# Patient Record
Sex: Male | Born: 1990 | Race: White | Hispanic: No | Marital: Single | Smoking: Former smoker
Health system: Southern US, Community
[De-identification: ages and names within clinical notes are randomized; demographics above are authoritative.]

## PROBLEM LIST (undated history)

## (undated) DIAGNOSIS — Z9889 Other specified postprocedural states: Secondary | ICD-10-CM

## (undated) DIAGNOSIS — F32A Depression, unspecified: Secondary | ICD-10-CM

## (undated) DIAGNOSIS — R112 Nausea with vomiting, unspecified: Secondary | ICD-10-CM

## (undated) DIAGNOSIS — F419 Anxiety disorder, unspecified: Secondary | ICD-10-CM

## (undated) DIAGNOSIS — J45909 Unspecified asthma, uncomplicated: Secondary | ICD-10-CM

## (undated) HISTORY — DX: Depression, unspecified: F32.A

## (undated) HISTORY — PX: KNEE SURGERY: SHX244

---
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
## 2011-10-16 ENCOUNTER — Emergency Department (HOSPITAL_COMMUNITY)
Admission: EM | Admit: 2011-10-16 | Discharge: 2011-10-16 | Disposition: A | Discharge status: Home / Self Care | Payer: Self-pay | Attending: Emergency Medicine | Admitting: Emergency Medicine

## 2011-10-16 ENCOUNTER — Encounter (HOSPITAL_COMMUNITY): Payer: Self-pay | Admitting: *Deleted

## 2011-10-16 DIAGNOSIS — J029 Acute pharyngitis, unspecified: Secondary | ICD-10-CM | POA: Insufficient documentation

## 2011-10-16 DIAGNOSIS — J039 Acute tonsillitis, unspecified: Secondary | ICD-10-CM

## 2011-10-16 DIAGNOSIS — Z882 Allergy status to sulfonamides status: Secondary | ICD-10-CM | POA: Insufficient documentation

## 2011-10-16 LAB — MONONUCLEOSIS SCREEN: Mono Screen: NEGATIVE

## 2011-10-16 MED ORDER — PREDNISONE 20 MG PO TABS
60.0000 mg | ORAL_TABLET | Freq: Once | ORAL | Status: AC
  Administered 2011-10-16: 60 mg via ORAL
  Filled 2011-10-16: qty 3

## 2011-10-16 MED ORDER — PREDNISONE 20 MG PO TABS: ORAL_TABLET | ORAL | Status: AC

## 2011-10-16 MED ORDER — PENICILLIN V POTASSIUM 500 MG PO TABS: 500.0000 mg | ORAL_TABLET | Freq: Four times a day (QID) | ORAL | Status: AC

## 2011-10-16 NOTE — ED Provider Notes (Signed)
History     CSN: [id number]  Arrival date & time 10/16/11  4098   First MD Initiated Contact with Patient 10/16/11 [phone]      Chief Complaint  Patient presents with  . Sore Throat    (Consider location/radiation/quality/duration/timing/severity/associated sxs/prior treatment) HPI Comments: Alexander Rivas 21 y.o. male   The chief complaint is: Patient presents with:   Sore Throat    21 year old male who presents today with a chief complaint of sore throat and pharyngeal swelling. He has had these symptoms for approximately a week and a half. 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 He states that he took some of his grandmothers antibiotics. He is not sure which time he took approximately 5 pills. Swelling seemed to go away, but has returned and is now worse. He has been taking 800 mg of ibuprofen twice a day without relief. He's been gargling salt water, which is also provided little relief. He has a history of strep infection as a child. He has had some objective fevers, chills, sweats, myalgias. He also complains of sinus congestion and rhinorrhea. He denies cough. Denies otalgia. States he has had difficulty swallowing and felt that it was difficult to breathe. This morning upon waking. He was awoken by choking on his own [place] This morning. As, nausea, vomiting, or abdominal pain.  The history is provided by the patient. No language interpreter was used.    History reviewed. No pertinent past medical history.  Past Surgical History  Procedure Date  . Knee surgery     No family history on file.  History  Substance Use Topics  . Smoking status: Never Smoker   . Smokeless tobacco: Not on file  . Alcohol Use: No      Review of Systems  Constitutional: Positive for fever (subjective) and chills.  HENT: Positive for congestion, sore throat, facial swelling, rhinorrhea, trouble swallowing, voice change, postnasal drip and sinus pressure. Negative for  hearing loss, ear pain and sneezing.   Eyes: Negative for pain, discharge and redness.  Respiratory: Negative for cough, shortness of breath and wheezing.   Cardiovascular: Negative for chest pain.  Gastrointestinal: Negative for nausea, vomiting, abdominal pain and diarrhea.  Musculoskeletal: Positive for myalgias.  Neurological: Positive for light-headedness. Negative for headaches.    Allergies  Sulfa antibiotics  Home Medications   Current Outpatient Rx  Name Route Sig Dispense Refill  . IBUPROFEN 200 MG PO TABS Oral Take 800 mg by mouth every 6 (six) hours as needed. Pain      BP 136/88  Pulse 79  Temp 98.2 F (36.8 C) (Oral)  Resp 18  SpO2 98%  Physical Exam  Nursing note and vitals reviewed. Constitutional: He appears well-developed and well-nourished. No distress.  HENT:  Head: Normocephalic and atraumatic.  Eyes: Conjunctivae are normal. No scleral icterus.  Neck: Normal range of motion. Neck supple.  Cardiovascular: Normal rate, regular rhythm and normal heart sounds.   Pulmonary/Chest: Effort normal and breath sounds normal. No respiratory distress.  Abdominal: Soft. There is no tenderness.       No splenomegaly  Musculoskeletal: He exhibits no edema.  Neurological: He is alert.  Skin: Skin is warm and dry. He is not diaphoretic.  Psychiatric: His behavior is normal.    ED Course  Procedures (including critical care time)  Results for orders placed during the hospital encounter of 10/16/11  RAPID STREP SCREEN      Component Value Range  Streptococcus, Group A Screen (Direct) NEGATIVE  NEGATIVE    No results found.  9:41 AM Filed Vitals:   10/16/11 0829  BP: 136/88  Pulse: 79  Temp: 98.2 F (36.8 C)  Resp: 18   Patient states that his sinus complaints are improving over the past few days. His chief complaint is a sore throat and swelling. Although his rapid strep screen was negative I've chosen to go ahead and treat the patient empirically. I  also am not going to culture the strep test as the patient is uninsured and I do not wish to the acrue of greater bill on his behalf. I'm also going to discharge the patient with prednisone for his uvular and tonsillar swelling. His vital signs look good and I am not concerned with any airway obstruction at this time. does not not have any evidence of Ludwig's angina. All questions answered fully. Patient expresses understanding. Discussed reasons to seek immediate care. Patient expresses understanding and agrees with plan.   No diagnosis found.    MDM  Patient is safe for discharge. We'll discharge home with penicillin and prednisone taper.          Arthor Captain, PA-C 10/16/11 [phone]

## 2011-10-16 NOTE — ED Notes (Signed)
Pt reports sore throat x1wk. Reports waking up with worse swelling after taking abx yesterday. Reports having 5 doses of the abx, does not remember the name of it. Throat noted to be very red and swollen. No exudate noted.

## 2011-10-16 NOTE — ED Provider Notes (Signed)
Medical screening examination/treatment/procedure(s) were performed by non-physician practitioner and as supervising physician I was immediately available for consultation/collaboration.  Tobin Chad, MD 10/16/11 1006

## 2013-07-28 ENCOUNTER — Emergency Department (HOSPITAL_COMMUNITY): Payer: Self-pay

## 2013-07-28 ENCOUNTER — Encounter (HOSPITAL_COMMUNITY): Payer: Self-pay | Admitting: Emergency Medicine

## 2013-07-28 ENCOUNTER — Emergency Department (HOSPITAL_COMMUNITY)
Admission: EM | Admit: 2013-07-28 | Discharge: 2013-07-28 | Disposition: A | Discharge status: Home / Self Care | Payer: Self-pay | Attending: Emergency Medicine | Admitting: Emergency Medicine

## 2013-07-28 DIAGNOSIS — R109 Unspecified abdominal pain: Secondary | ICD-10-CM | POA: Insufficient documentation

## 2013-07-28 DIAGNOSIS — R110 Nausea: Secondary | ICD-10-CM | POA: Insufficient documentation

## 2013-07-28 LAB — CBC WITH DIFFERENTIAL/PLATELET
BASOS ABS: 0 10*3/uL (ref 0.0–0.1)
Basophils Relative: 0 % (ref 0–1)
Eosinophils Absolute: 0.4 10*3/uL (ref 0.0–0.7)
Eosinophils Relative: 7 % — ABNORMAL HIGH (ref 0–5)
HEMATOCRIT: 44 % (ref 39.0–52.0)
HEMOGLOBIN: 14.7 g/dL (ref 13.0–17.0)
LYMPHS ABS: 2.6 10*3/uL (ref 0.7–4.0)
LYMPHS PCT: 46 % (ref 12–46)
MCH: 30.2 pg (ref 26.0–34.0)
MCHC: 33.4 g/dL (ref 30.0–36.0)
MCV: 90.5 fL (ref 78.0–100.0)
Monocytes Absolute: 0.4 10*3/uL (ref 0.1–1.0)
Monocytes Relative: 7 % (ref 3–12)
NEUTROS ABS: 2.2 10*3/uL (ref 1.7–7.7)
Neutrophils Relative %: 40 % — ABNORMAL LOW (ref 43–77)
PLATELETS: 209 10*3/uL (ref 150–400)
RBC: 4.86 MIL/uL (ref 4.22–5.81)
RDW: 13.4 % (ref 11.5–15.5)
WBC: 5.6 10*3/uL (ref 4.0–10.5)

## 2013-07-28 LAB — COMPREHENSIVE METABOLIC PANEL WITH GFR
ALT: 26 U/L (ref 0–53)
AST: 18 U/L (ref 0–37)
Albumin: 4.1 g/dL (ref 3.5–5.2)
Alkaline Phosphatase: 88 U/L (ref 39–117)
BUN: 13 mg/dL (ref 6–23)
CO2: 28 meq/L (ref 19–32)
Calcium: 9.6 mg/dL (ref 8.4–10.5)
Chloride: 105 meq/L (ref 96–112)
Creatinine, Ser: 1.05 mg/dL (ref 0.50–1.35)
GFR calc Af Amer: >90 mL/min (ref >90)
GFR calc non Af Amer: >90 mL/min (ref >90)
Glucose, Bld: 93 mg/dL (ref 70–99)
Potassium: 3.9 meq/L (ref 3.7–5.3)
Sodium: 145 meq/L (ref 137–147)
Total Bilirubin: 0.3 mg/dL (ref 0.3–1.2)
Total Protein: 7.1 g/dL (ref 6.0–8.3)

## 2013-07-28 LAB — URINALYSIS, ROUTINE W REFLEX MICROSCOPIC
Glucose, UA: NEGATIVE mg/dL
Hgb urine dipstick: NEGATIVE
Leukocytes, UA: NEGATIVE
Nitrite: NEGATIVE
Protein, ur: NEGATIVE mg/dL
Specific Gravity, Urine: >1.03 — ABNORMAL HIGH (ref 1.005–1.030)
Urobilinogen, UA: 1 mg/dL (ref 0.0–1.0)
pH: 6 (ref 5.0–8.0)

## 2013-07-28 LAB — LIPASE, BLOOD: Lipase: 38 U/L (ref 11–59)

## 2013-07-28 MED ORDER — SODIUM CHLORIDE 0.9 % IV BOLUS (SEPSIS)
500.0000 mL | Freq: Once | INTRAVENOUS | Status: AC
  Administered 2013-07-28: 500 mL via INTRAVENOUS

## 2013-07-28 MED ORDER — KETOROLAC TROMETHAMINE 60 MG/2ML IM SOLN
60.0000 mg | Freq: Once | INTRAMUSCULAR | Status: AC
  Administered 2013-07-28: 60 mg via INTRAMUSCULAR
  Filled 2013-07-28: qty 2

## 2013-07-28 MED ORDER — FENTANYL CITRATE 0.05 MG/ML IJ SOLN
75.0000 ug | Freq: Once | INTRAMUSCULAR | Status: AC
  Administered 2013-07-28: 75 ug via INTRAVENOUS
  Filled 2013-07-28: qty 2

## 2013-07-28 NOTE — ED Notes (Signed)
Patient c/o LUQ pain that started yesterday; states has had nausea.

## 2013-07-28 NOTE — Discharge Instructions (Signed)
Take tylenol and motrin as needed for pain. Take tylenol every 4 hours as needed (15 mg per kg) and take motrin (ibuprofen) every 6 hours as needed for fever or pain (10 mg per kg). Return for any changes, weird rashes, neck stiffness, change in behavior, new or worsening concerns.  Follow up with your physician as directed. Thank you Filed Vitals:   07/28/13 0024  BP: 132/82  Pulse: 88  Temp: 98.9 F (37.2 C)  TempSrc: Oral  Resp: 20  SpO2: 97%

## 2013-07-28 NOTE — ED Provider Notes (Signed)
CSN: [id number]     Arrival date & time 07/28/13  0019 History   First MD Initiated Contact with Patient 07/28/13 0325     Chief Complaint  Patient presents with  . Abdominal Pain     (Consider location/radiation/quality/duration/timing/severity/associated sxs/prior Treatment) HPI Comments: 23 year old male with no significant medical or surgery history, nonsmoker no alcohol use presents with left upper quadrant abdominal pain with radiation to left flank. Symptoms are gradually worsened since yesterday with mild nausea no vomiting. No known ulcers or GI history. No bleeding. No fevers or chills. Not physically worse after eating. No history of kidney stones or urinary symptoms.  Patient is a 23 y.o. male presenting with abdominal pain. The history is provided by the patient.  Abdominal Pain Associated symptoms: nausea   Associated symptoms: no chest pain, no chills, no dysuria, no fever, no shortness of breath and no vomiting     History reviewed. No pertinent past medical history. Past Surgical History  Procedure Laterality Date  . Knee surgery     No family history on file. History  Substance Use Topics  . Smoking status: Never Smoker   . Smokeless tobacco: Not on file  . Alcohol Use: No    Review of Systems  Constitutional: Negative for fever and chills.  Respiratory: Negative for shortness of breath.   Cardiovascular: Negative for chest pain.  Gastrointestinal: Positive for nausea and abdominal pain. Negative for vomiting and blood in stool.  Genitourinary: Positive for flank pain. Negative for dysuria.  Musculoskeletal: Negative for back pain, neck pain and neck stiffness.  Skin: Negative for rash.  Neurological: Negative for light-headedness and headaches.      Allergies  Codeine and Sulfa antibiotics  Home Medications   Prior to Admission medications   Medication Sig Start Date End Date Taking? Authorizing Provider  ibuprofen (ADVIL,MOTRIN) 200 MG tablet Take  800 mg by mouth every 6 (six) hours as needed. Pain    Historical Provider, MD   BP 132/82  Pulse 88  Temp(Src) 98.9 F (37.2 C) (Oral)  Resp 20  SpO2 97% Physical Exam  Nursing note and vitals reviewed. Constitutional: He is oriented to person, place, and time. He appears well-developed and well-nourished.  HENT:  Head: Normocephalic and atraumatic.  Eyes: Conjunctivae are normal. Right eye exhibits no discharge. Left eye exhibits no discharge.  Neck: Normal range of motion. Neck supple. No tracheal deviation present.  Cardiovascular: Normal rate and regular rhythm.   Pulmonary/Chest: Effort normal and breath sounds normal.  Abdominal: Soft. He exhibits no distension. There is tenderness (mild left upper quadrant and left flank.). There is no guarding.  Musculoskeletal: He exhibits no edema.  Neurological: He is alert and oriented to person, place, and time.  Skin: Skin is warm. No rash noted.  Psychiatric: He has a normal mood and affect.    ED Course  Procedures (including critical care time) Emergency Focused Ultrasound Exam Limited retroperitoneal ultrasound of kidneys  Performed and interpreted by Dr. Jodi MourningZavitz Indication: flank pain Focused abdominal ultrasound with both kidneys imaged in transverse and longitudinal planes in real-time. Interpretation: no hydronephrosis visualized.  no stones or cysts visualized  Images archived electronically  Labs Review Labs Reviewed  URINALYSIS, ROUTINE W REFLEX MICROSCOPIC - Abnormal; Notable for the following:    Specific Gravity, Urine >1.030 (*)    Bilirubin Urine SMALL (*)    Ketones, ur TRACE (*)    All other components within normal limits  CBC WITH DIFFERENTIAL - Abnormal; Notable for  the following:    Neutrophils Relative % 40 (*)    Eosinophils Relative 7 (*)    All other components within normal limits  COMPREHENSIVE METABOLIC PANEL  LIPASE, BLOOD    Imaging Review Ct Abdomen Pelvis Wo Contrast  07/28/2013    CLINICAL DATA:  Left upper quadrant pain starting yesterday. Nausea. Left flank pain.  EXAM: CT ABDOMEN AND PELVIS WITHOUT CONTRAST  TECHNIQUE: Multidetector CT imaging of the abdomen and pelvis was performed following the standard protocol without IV contrast.  COMPARISON:  None.  FINDINGS: The lung bases are clear.  The kidneys appear symmetrical. No pyelocaliectasis or ureterectasis. No renal, ureteral, or bladder stones are identified. The bladder is decompressed.  The unenhanced appearance of the liver, spleen, gallbladder, pancreas, adrenal glands, abdominal aorta, inferior vena cava, and retroperitoneal lymph nodes is unremarkable. The stomach and small bowel are decompressed. Stool-filled colon without distention. No free air or free fluid in the abdomen.  Pelvis: The appendix is normal. No evidence of diverticulitis. No free or loculated pelvic fluid collections. No pelvic mass or lymphadenopathy. Prostate gland is not enlarged. No destructive bone lesions.  IMPRESSION: No renal or ureteral stone or obstruction demonstrated.   Electronically Signed   By: Burman Nieves M.D.   On: 07/28/2013 05:49     EKG Interpretation None      MDM   Final diagnoses:  Left flank pain   Well-appearing male with left upper abdominal and left flank tenderness. Abdomen exam benign at this time. Discussed differential closing gastric, kidney stone, kidney infection,colon related, other. Bedside ultrasound no significant hydro Toradol given for pain and pain improved.   CT stone study no acute findings. On further recheck patient's pain worsening left upper quadrant. Abdominal labs ordered to look for signs of pancreatitis and morphine given for pain.  Pt improved on recheck, fup outpt. Results and differential diagnosis were discussed with the patient/parent/guardian. Close follow up outpatient was discussed, comfortable with the plan.   Medications  ketorolac (TORADOL) injection 60 mg (60 mg  Intramuscular Given 07/28/13 0432)  sodium chloride 0.9 % bolus 500 mL (0 mLs Intravenous Stopped 07/28/13 0744)  fentaNYL (SUBLIMAZE) injection 75 mcg (75 mcg Intravenous Given 07/28/13 0709)    Filed Vitals:   07/28/13 0024 07/28/13 0652  BP: 132/82 130/73  Pulse: 88 59  Temp: 98.9 F (37.2 C)   TempSrc: Oral   Resp: 20 20  SpO2: 97% 99%         Enid Skeens, MD 07/29/13 [phone]

## 2013-12-10 ENCOUNTER — Encounter (HOSPITAL_COMMUNITY): Payer: Self-pay | Admitting: Emergency Medicine

## 2013-12-10 ENCOUNTER — Emergency Department (HOSPITAL_COMMUNITY)
Admission: EM | Admit: 2013-12-10 | Discharge: 2013-12-11 | Disposition: A | Discharge status: Home / Self Care | Payer: Self-pay | Attending: Emergency Medicine | Admitting: Emergency Medicine

## 2013-12-10 DIAGNOSIS — J45901 Unspecified asthma with (acute) exacerbation: Secondary | ICD-10-CM | POA: Insufficient documentation

## 2013-12-10 DIAGNOSIS — K5900 Constipation, unspecified: Secondary | ICD-10-CM | POA: Insufficient documentation

## 2013-12-10 DIAGNOSIS — R109 Unspecified abdominal pain: Secondary | ICD-10-CM

## 2013-12-10 DIAGNOSIS — R1011 Right upper quadrant pain: Secondary | ICD-10-CM

## 2013-12-10 DIAGNOSIS — R110 Nausea: Secondary | ICD-10-CM | POA: Insufficient documentation

## 2013-12-10 DIAGNOSIS — Z720 Tobacco use: Secondary | ICD-10-CM | POA: Insufficient documentation

## 2013-12-10 HISTORY — DX: Unspecified asthma, uncomplicated: J45.909

## 2013-12-10 NOTE — ED Provider Notes (Signed)
CSN: [id number]     Arrival date & time 12/10/13  2337 History   First MD Initiated Contact with Patient 12/10/13 2348     Chief Complaint  Patient presents with  . Abdominal Pain     (Consider location/radiation/quality/duration/timing/severity/associated sxs/prior Treatment) HPI Comments: Pt reports abd pain that started Friday Oct 16.. This was made worse when he attempted to catch a 200lb cable reel as it fell off a truck this afternoon. The problem got progressively worse as the night progressed. No n/v/diarrhea. Pt does c/o constipation. No blood in urine or stool. No previous abd operations.  Patient is a 23 y.o. male presenting with abdominal pain. The history is provided by the patient.  Abdominal Pain Pain quality: pressure and sharp   Pain quality comment:  Stabbing pain Pain radiates to:  RUQ Duration:  5 days Timing:  Intermittent Progression:  Worsening Chronicity:  New Context: alcohol use   Context: not recent illness and not recent travel   Relieved by:  Nothing Worsened by:  Movement (lifting) Ineffective treatments:  None tried Associated symptoms: constipation and nausea   Associated symptoms: no chest pain, no cough, no dysuria, no hematochezia, no hematuria and no shortness of breath   Risk factors: NSAID use     Past Medical History  Diagnosis Date  . Asthma    Past Surgical History  Procedure Laterality Date  . Knee surgery     No family history on file. History  Substance Use Topics  . Smoking status: Current Every Day Smoker  . Smokeless tobacco: Not on file  . Alcohol Use: Yes    Review of Systems  Constitutional: Negative for activity change.       All ROS Neg except as noted in HPI  HENT: Negative for nosebleeds.   Eyes: Negative for photophobia and discharge.  Respiratory: Positive for wheezing. Negative for cough and shortness of breath.   Cardiovascular: Negative for chest pain and palpitations.  Gastrointestinal: Positive for  nausea, abdominal pain and constipation. Negative for blood in stool and hematochezia.  Genitourinary: Negative for dysuria, frequency and hematuria.  Musculoskeletal: Negative for arthralgias, back pain and neck pain.  Skin: Negative.   Neurological: Negative for dizziness, seizures and speech difficulty.  Psychiatric/Behavioral: Negative for hallucinations and confusion.      Allergies  Codeine and Sulfa antibiotics  Home Medications   Prior to Admission medications   Medication Sig Start Date End Date Taking? Authorizing Provider  ibuprofen (ADVIL,MOTRIN) 200 MG tablet Take 800 mg by mouth every 6 (six) hours as needed. Pain    Historical Provider, MD   BP 121/72  Pulse 81  Temp(Src) 97.8 F (36.6 C) (Oral)  Resp 18  Ht 6' (1.829 m)  Wt 265 lb (120.203 kg)  BMI 35.93 kg/m2  SpO2 99% Physical Exam  Nursing note and vitals reviewed. Constitutional: He is oriented to person, place, and time. He appears well-developed and well-nourished.  Non-toxic appearance.  HENT:  Head: Normocephalic.  Right Ear: Tympanic membrane and external ear normal.  Left Ear: Tympanic membrane and external ear normal.  Eyes: EOM and lids are normal. Pupils are equal, round, and reactive to light.  Neck: Normal range of motion. Neck supple. Carotid bruit is not present.  Cardiovascular: Normal rate, regular rhythm, normal heart sounds, intact distal pulses and normal pulses.   Pulmonary/Chest: Breath sounds normal. No respiratory distress.  Abdominal: Soft. Bowel sounds are normal. He exhibits no distension, no fluid wave, no pulsatile midline mass and  no mass. There is tenderness in the right upper quadrant. There is no guarding and no CVA tenderness.  Genitourinary: Rectum normal. Guaiac negative stool.  Musculoskeletal: Normal range of motion.  Lymphadenopathy:       Head (right side): No submandibular adenopathy present.       Head (left side): No submandibular adenopathy present.    He has  no cervical adenopathy.  Neurological: He is alert and oriented to person, place, and time. He has normal strength. No cranial nerve deficit or sensory deficit.  Skin: Skin is warm and dry.  Psychiatric: He has a normal mood and affect. His speech is normal.    ED Course  Procedures (including critical care time) Labs Review Labs Reviewed - No data to display  Imaging Review No results found.   EKG Interpretation None      MDM  Vital signs stable. Stool negative for occult blood. CBC and Bmet wnl. Hepatic function test pending. CT scan pending. Pt's care to be continued by Dr Manus Gunningancour 1:57am.   Final diagnoses:  RUQ discomfort  Abdominal wall pain    **I have reviewed nursing notes, vital signs, and all appropriate lab and imaging results for this patient.    Kathie DikeHobson M Frona Yost, PA-C 12/11/13 1635

## 2013-12-10 NOTE — ED Notes (Signed)
Pt states this afternoon a large cable reel fell off of a truck and he tried to catch it, having pain in his abd from same, states he had also been having abd pain for a few days prior

## 2013-12-11 ENCOUNTER — Emergency Department (HOSPITAL_COMMUNITY): Payer: Self-pay

## 2013-12-11 LAB — HEPATIC FUNCTION PANEL
ALBUMIN: 4.3 g/dL (ref 3.5–5.2)
ALK PHOS: 101 U/L (ref 39–117)
ALT: 41 U/L (ref 0–53)
AST: 34 U/L (ref 0–37)
Bilirubin, Direct: <0.2 mg/dL (ref 0.0–0.3)
Total Bilirubin: 0.5 mg/dL (ref 0.3–1.2)
Total Protein: 7.6 g/dL (ref 6.0–8.3)

## 2013-12-11 LAB — LIPASE, BLOOD: LIPASE: 25 U/L (ref 11–59)

## 2013-12-11 LAB — CBC WITH DIFFERENTIAL/PLATELET
Basophils Absolute: 0 10*3/uL (ref 0.0–0.1)
Basophils Relative: 0 % (ref 0–1)
EOS ABS: 0.3 10*3/uL (ref 0.0–0.7)
EOS PCT: 4 % (ref 0–5)
HEMATOCRIT: 45.4 % (ref 39.0–52.0)
Hemoglobin: 15.6 g/dL (ref 13.0–17.0)
Lymphocytes Relative: 36 % (ref 12–46)
Lymphs Abs: 2.8 10*3/uL (ref 0.7–4.0)
MCH: 30.6 pg (ref 26.0–34.0)
MCHC: 34.4 g/dL (ref 30.0–36.0)
MCV: 89 fL (ref 78.0–100.0)
MONO ABS: 0.5 10*3/uL (ref 0.1–1.0)
MONOS PCT: 6 % (ref 3–12)
NEUTROS PCT: 54 % (ref 43–77)
Neutro Abs: 4.2 10*3/uL (ref 1.7–7.7)
PLATELETS: 331 10*3/uL (ref 150–400)
RBC: 5.1 MIL/uL (ref 4.22–5.81)
RDW: 13.2 % (ref 11.5–15.5)
WBC: 7.7 10*3/uL (ref 4.0–10.5)

## 2013-12-11 LAB — BASIC METABOLIC PANEL
Anion gap: 17 — ABNORMAL HIGH (ref 5–15)
BUN: 16 mg/dL (ref 6–23)
CALCIUM: 9.3 mg/dL (ref 8.4–10.5)
CO2: 22 mEq/L (ref 19–32)
Chloride: 100 mEq/L (ref 96–112)
Creatinine, Ser: 0.89 mg/dL (ref 0.50–1.35)
Glucose, Bld: 95 mg/dL (ref 70–99)
POTASSIUM: 3.9 meq/L (ref 3.7–5.3)
SODIUM: 139 meq/L (ref 137–147)

## 2013-12-11 LAB — URINALYSIS, ROUTINE W REFLEX MICROSCOPIC
BILIRUBIN URINE: NEGATIVE
Glucose, UA: NEGATIVE mg/dL
Hgb urine dipstick: NEGATIVE
Ketones, ur: NEGATIVE mg/dL
Leukocytes, UA: NEGATIVE
NITRITE: NEGATIVE
PH: 6 (ref 5.0–8.0)
Protein, ur: NEGATIVE mg/dL
Specific Gravity, Urine: >1.03 — ABNORMAL HIGH (ref 1.005–1.030)
UROBILINOGEN UA: 0.2 mg/dL (ref 0.0–1.0)

## 2013-12-11 MED ORDER — HYDROCODONE-ACETAMINOPHEN 5-325 MG PO TABS: 2.0000 | ORAL_TABLET | ORAL | Status: DC | PRN

## 2013-12-11 MED ORDER — ONDANSETRON HCL 4 MG/2ML IJ SOLN
4.0000 mg | Freq: Once | INTRAMUSCULAR | Status: AC
  Administered 2013-12-11: 4 mg via INTRAVENOUS
  Filled 2013-12-11: qty 2

## 2013-12-11 MED ORDER — MORPHINE SULFATE 4 MG/ML IJ SOLN
4.0000 mg | Freq: Once | INTRAMUSCULAR | Status: AC
  Administered 2013-12-11: 4 mg via INTRAVENOUS
  Filled 2013-12-11: qty 1

## 2013-12-11 MED ORDER — IOHEXOL 300 MG/ML  SOLN
25.0000 mL | Freq: Once | INTRAMUSCULAR | Status: AC | PRN
  Administered 2013-12-11: 25 mL via ORAL

## 2013-12-11 MED ORDER — IOHEXOL 300 MG/ML  SOLN
100.0000 mL | Freq: Once | INTRAMUSCULAR | Status: AC | PRN
  Administered 2013-12-11: 100 mL via INTRAVENOUS

## 2013-12-11 MED ORDER — IBUPROFEN 800 MG PO TABS: 800.0000 mg | ORAL_TABLET | Freq: Three times a day (TID) | ORAL | Status: DC

## 2013-12-11 NOTE — ED Provider Notes (Signed)
Medical screening examination/treatment/procedure(s) were conducted as a shared visit with non-physician practitioner(s) and myself.  I personally evaluated the patient during the encounter.  Ongoing RUQ discomfort since 10/16, worse today when he caught falling object.  Object did not strike him.  Obese abdomen, soft, no ecchymosis.  TTP RUQ and epigastrium without peritoneal signs.  EKG Interpretation None        Glynn OctaveStephen Kaycee Mcgaugh, MD 12/11/13 2248

## 2013-12-11 NOTE — Discharge Instructions (Signed)
Abdominal Pain Limiting your lifting to less than 10 pounds until you follow up with a doctor. Return to the ED if you develop new or worsening symptoms. Many things can cause abdominal pain. Usually, abdominal pain is not caused by a disease and will improve without treatment. It can often be observed and treated at home. Your health care provider will do a physical exam and possibly order blood tests and X-rays to help determine the seriousness of your pain. However, in many cases, more time must pass before a clear cause of the pain can be found. Before that point, your health care provider may not know if you need more testing or further treatment. HOME CARE INSTRUCTIONS  Monitor your abdominal pain for any changes. The following actions may help to alleviate any discomfort you are experiencing:  Only take over-the-counter or prescription medicines as directed by your health care provider.  Do not take laxatives unless directed to do so by your health care provider.  Try a clear liquid diet (broth, tea, or water) as directed by your health care provider. Slowly move to a bland diet as tolerated. SEEK MEDICAL CARE IF:  You have unexplained abdominal pain.  You have abdominal pain associated with nausea or diarrhea.  You have pain when you urinate or have a bowel movement.  You experience abdominal pain that wakes you in the night.  You have abdominal pain that is worsened or improved by eating food.  You have abdominal pain that is worsened with eating fatty foods.  You have a fever. SEEK IMMEDIATE MEDICAL CARE IF:   Your pain does not go away within 2 hours.  You keep throwing up (vomiting).  Your pain is felt only in portions of the abdomen, such as the right side or the left lower portion of the abdomen.  You pass bloody or black tarry stools. MAKE SURE YOU:  Understand these instructions.   Will watch your condition.   Will get help right away if you are not doing  well or get worse.  Document Released: 11/16/2004 Document Revised: 02/11/2013 Document Reviewed: 10/16/2012 [facility] Patient Information 2015 [place], [place]. This information is not intended to replace advice given to you by your health care provider. Make sure you discuss any questions you have with your health care provider.

## 2013-12-22 ENCOUNTER — Encounter (HOSPITAL_COMMUNITY): Payer: Self-pay | Admitting: *Deleted

## 2013-12-22 ENCOUNTER — Emergency Department (HOSPITAL_COMMUNITY)
Admission: EM | Admit: 2013-12-22 | Discharge: 2013-12-22 | Disposition: A | Discharge status: Home / Self Care | Payer: Self-pay | Attending: Emergency Medicine | Admitting: Emergency Medicine

## 2013-12-22 DIAGNOSIS — R21 Rash and other nonspecific skin eruption: Secondary | ICD-10-CM | POA: Insufficient documentation

## 2013-12-22 DIAGNOSIS — R1011 Right upper quadrant pain: Secondary | ICD-10-CM | POA: Insufficient documentation

## 2013-12-22 DIAGNOSIS — J45909 Unspecified asthma, uncomplicated: Secondary | ICD-10-CM | POA: Insufficient documentation

## 2013-12-22 DIAGNOSIS — Z720 Tobacco use: Secondary | ICD-10-CM | POA: Insufficient documentation

## 2013-12-22 DIAGNOSIS — Z791 Long term (current) use of non-steroidal anti-inflammatories (NSAID): Secondary | ICD-10-CM | POA: Insufficient documentation

## 2013-12-22 DIAGNOSIS — R109 Unspecified abdominal pain: Secondary | ICD-10-CM

## 2013-12-22 LAB — CBC WITH DIFFERENTIAL/PLATELET
BASOS PCT: 0 % (ref 0–1)
Basophils Absolute: 0 10*3/uL (ref 0.0–0.1)
EOS PCT: 5 % (ref 0–5)
Eosinophils Absolute: 0.4 10*3/uL (ref 0.0–0.7)
HEMATOCRIT: 46.9 % (ref 39.0–52.0)
Hemoglobin: 15.8 g/dL (ref 13.0–17.0)
Lymphocytes Relative: 32 % (ref 12–46)
Lymphs Abs: 2.5 10*3/uL (ref 0.7–4.0)
MCH: 30.4 pg (ref 26.0–34.0)
MCHC: 33.7 g/dL (ref 30.0–36.0)
MCV: 90.4 fL (ref 78.0–100.0)
MONO ABS: 0.3 10*3/uL (ref 0.1–1.0)
MONOS PCT: 4 % (ref 3–12)
NEUTROS ABS: 4.5 10*3/uL (ref 1.7–7.7)
Neutrophils Relative %: 59 % (ref 43–77)
Platelets: 269 10*3/uL (ref 150–400)
RBC: 5.19 MIL/uL (ref 4.22–5.81)
RDW: 13.1 % (ref 11.5–15.5)
WBC: 7.7 10*3/uL (ref 4.0–10.5)

## 2013-12-22 LAB — COMPREHENSIVE METABOLIC PANEL
ALT: 30 U/L (ref 0–53)
ANION GAP: 12 (ref 5–15)
AST: 16 U/L (ref 0–37)
Albumin: 4.2 g/dL (ref 3.5–5.2)
Alkaline Phosphatase: 105 U/L (ref 39–117)
BUN: 13 mg/dL (ref 6–23)
CO2: 27 mEq/L (ref 19–32)
CREATININE: 0.95 mg/dL (ref 0.50–1.35)
Calcium: 10 mg/dL (ref 8.4–10.5)
Chloride: 102 mEq/L (ref 96–112)
GFR calc Af Amer: >90 mL/min (ref >90)
GFR calc non Af Amer: >90 mL/min (ref >90)
Glucose, Bld: 80 mg/dL (ref 70–99)
Potassium: 4.4 mEq/L (ref 3.7–5.3)
Sodium: 141 mEq/L (ref 137–147)
TOTAL PROTEIN: 7.8 g/dL (ref 6.0–8.3)
Total Bilirubin: 0.3 mg/dL (ref 0.3–1.2)

## 2013-12-22 NOTE — ED Provider Notes (Signed)
CSN: [id number]     Arrival date & time 12/22/13  1659 History  This chart was scribed for Alexander LennertJoseph L Llesenia Fogal, MD by Haywood PaoNadim Abu Hashem, ED Scribe. The patient was seen in [facility] and the patient's care was started at 7:41 PM.    Chief Complaint  Patient presents with  . Abdominal Pain   Patient is a 23 y.o. male presenting with abdominal pain. The history is provided by the patient. No language interpreter was used.  Abdominal Pain Pain location:  RUQ Pain radiates to:  Does not radiate Pain severity:  Mild Onset quality:  Sudden Timing:  Constant Chronicity:  Recurrent Context: trauma   Relieved by:  Nothing Worsened by:  Nothing tried Ineffective treatments:  None tried Associated symptoms: no chest pain, no cough, no diarrhea, no fatigue and no hematuria    HPI Comments: Alexander Rivas is a 23 y.o. male who presents to the Emergency Department complaining of RUQ abdominal pain. He states he came to the ED two weeks ago because he pulled a muscle in his abdomen while at work. Pt states he tried to catch a reel cable which fell on his chest and it pulled him forward. He was told to take 2 days off from work. Pt states he felt better after taking off but he re-injured himself when he returned to work. He denies CP.  Past Medical History  Diagnosis Date  . Asthma    Past Surgical History  Procedure Laterality Date  . Knee surgery     History reviewed. No pertinent family history. History  Substance Use Topics  . Smoking status: Current Every Day Smoker  . Smokeless tobacco: Not on file  . Alcohol Use: Yes    Review of Systems  Constitutional: Negative for appetite change and fatigue.  HENT: Negative for congestion, ear discharge and sinus pressure.   Eyes: Negative for discharge.  Respiratory: Negative for cough.   Cardiovascular: Negative for chest pain.  Gastrointestinal: Positive for abdominal pain. Negative for diarrhea.  Genitourinary: Negative for frequency and  hematuria.  Musculoskeletal: Negative for back pain.  Skin: Positive for rash.  Neurological: Negative for seizures and headaches.  Psychiatric/Behavioral: Negative for hallucinations.      Allergies  Codeine and Sulfa antibiotics  Home Medications   Prior to Admission medications   Medication Sig Start Date End Date Taking? Authorizing Provider  HYDROcodone-acetaminophen (NORCO/VICODIN) 5-325 MG per tablet Take 2 tablets by mouth every 4 (four) hours as needed. 12/11/13   Glynn OctaveStephen Rancour, MD  ibuprofen (ADVIL,MOTRIN) 200 MG tablet Take 800 mg by mouth every 6 (six) hours as needed. Pain    Historical Provider, MD  ibuprofen (ADVIL,MOTRIN) 800 MG tablet Take 1 tablet (800 mg total) by mouth 3 (three) times daily. 12/11/13   Glynn OctaveStephen Rancour, MD   BP 129/67 mmHg  Pulse 67  Temp(Src) 98.7 F (37.1 C) (Oral)  Resp 18  Ht 6' (1.829 m)  Wt 265 lb (120.203 kg)  BMI 35.93 kg/m2  SpO2 98% Physical Exam  Constitutional: He is oriented to person, place, and time. He appears well-developed.  HENT:  Head: Normocephalic.  Eyes: Conjunctivae and EOM are normal. No scleral icterus.  Neck: Neck supple. No thyromegaly present.  Cardiovascular: Normal rate and regular rhythm.  Exam reveals no gallop and no friction rub.   No murmur heard. Pulmonary/Chest: No stridor. He has no wheezes. He has no rales. He exhibits no tenderness.  Abdominal: He exhibits no distension. There is no tenderness. There  is no rebound.  RUQ tenderness  Musculoskeletal: Normal range of motion. He exhibits no edema.  Lymphadenopathy:    He has no cervical adenopathy.  Neurological: He is oriented to person, place, and time. He exhibits normal muscle tone. Coordination normal.  Skin: No rash noted. No erythema.  Maculopapular rash on abdomen and chest  Psychiatric: He has a normal mood and affect. His behavior is normal.    ED Course  Procedures  DIAGNOSTIC STUDIES: Oxygen Saturation is 98% on room air, normal  by my interpretation.    COORDINATION OF CARE: 7:46 PM Discussed treatment plan with pt at bedside and pt agreed to plan.   Labs Review Labs Reviewed - No data to display  Imaging Review No results found.   EKG Interpretation None      MDM   Final diagnoses:  None     Abdominal wall contusion and strain  The chart was scribed for me under my direct supervision.  I personally performed the history, physical, and medical decision making and all procedures in the evaluation of this patient.Alexander Rivas.     Shalaunda Weatherholtz L Margan Elias, MD 12/22/13 2039

## 2013-12-22 NOTE — Discharge Instructions (Signed)
Take motrin 800 md three times a day

## 2013-12-22 NOTE — ED Notes (Signed)
MD at bedside. 

## 2013-12-22 NOTE — ED Notes (Signed)
abd pain since injury at work, seen here 10/21, cont to have abd pain and wants a note for work  Nausea,

## 2014-01-20 ENCOUNTER — Emergency Department (HOSPITAL_COMMUNITY): Payer: Worker's Compensation

## 2014-01-20 ENCOUNTER — Encounter (HOSPITAL_COMMUNITY): Payer: Self-pay | Admitting: *Deleted

## 2014-01-20 ENCOUNTER — Emergency Department (HOSPITAL_COMMUNITY)
Admission: EM | Admit: 2014-01-20 | Discharge: 2014-01-20 | Disposition: A | Discharge status: Home / Self Care | Payer: Worker's Compensation | Attending: Emergency Medicine | Admitting: Emergency Medicine

## 2014-01-20 DIAGNOSIS — Y990 Civilian activity done for income or pay: Secondary | ICD-10-CM | POA: Insufficient documentation

## 2014-01-20 DIAGNOSIS — Y9389 Activity, other specified: Secondary | ICD-10-CM | POA: Insufficient documentation

## 2014-01-20 DIAGNOSIS — Y9289 Other specified places as the place of occurrence of the external cause: Secondary | ICD-10-CM | POA: Insufficient documentation

## 2014-01-20 DIAGNOSIS — W231XXA Caught, crushed, jammed, or pinched between stationary objects, initial encounter: Secondary | ICD-10-CM | POA: Insufficient documentation

## 2014-01-20 DIAGNOSIS — J45909 Unspecified asthma, uncomplicated: Secondary | ICD-10-CM | POA: Insufficient documentation

## 2014-01-20 DIAGNOSIS — T1490XA Injury, unspecified, initial encounter: Secondary | ICD-10-CM

## 2014-01-20 DIAGNOSIS — S5002XA Contusion of left elbow, initial encounter: Secondary | ICD-10-CM | POA: Insufficient documentation

## 2014-01-20 DIAGNOSIS — Z720 Tobacco use: Secondary | ICD-10-CM | POA: Insufficient documentation

## 2014-01-20 DIAGNOSIS — T148XXA Other injury of unspecified body region, initial encounter: Secondary | ICD-10-CM

## 2014-01-20 MED ORDER — IBUPROFEN 800 MG PO TABS: 800.0000 mg | ORAL_TABLET | Freq: Three times a day (TID) | ORAL | Status: DC

## 2014-01-20 NOTE — Discharge Instructions (Signed)
Contusion °A contusion is a deep bruise. Contusions happen when an injury causes bleeding under the skin. Signs of bruising include pain, puffiness (swelling), and discolored skin. The contusion may turn blue, purple, or yellow. °HOME CARE  °· Put ice on the injured area. °¨ Put ice in a plastic bag. °¨ Place a towel between your skin and the bag. °¨ Leave the ice on for 15-20 minutes, 03-04 times a day. °· Only take medicine as told by your doctor. °· Rest the injured area. °· If possible, raise (elevate) the injured area to lessen puffiness. °GET HELP RIGHT AWAY IF:  °· You have more bruising or puffiness. °· You have pain that is getting worse. °· Your puffiness or pain is not helped by medicine. °MAKE SURE YOU:  °· Understand these instructions. °· Will watch your condition. °· Will get help right away if you are not doing well or get worse. °Document Released: 07/26/2007 Document Revised: 05/01/2011 Document Reviewed: 12/12/2010 °[employer]® Patient Information ©2015 [employer]. This information is not intended to replace advice given to you by your health care provider. Make sure you discuss any questions you have with your health care provider. ° °

## 2014-01-20 NOTE — ED Notes (Signed)
Lt elbow pain, after a box fell on his arm.

## 2014-01-20 NOTE — ED Provider Notes (Signed)
CSN: [id number]     Arrival date & time 01/20/14  1255 History   First MD Initiated Contact with Patient 01/20/14 1407     Chief Complaint  Patient presents with  . Elbow Pain     (Consider location/radiation/quality/duration/timing/severity/associated sxs/prior Treatment) HPI Comments: Pt comes in today with complaint of left elbow pain.pt states that last night he got his arm between a box and the wall at work and now it is swollen and painful. Denies numbness or weakness. Hasn't tried anything for the symptoms.no previous injury  The history is provided by the patient. No language interpreter was used.    Past Medical History  Diagnosis Date  . Asthma    Past Surgical History  Procedure Laterality Date  . Knee surgery     History reviewed. No pertinent family history. History  Substance Use Topics  . Smoking status: Current Every Day Smoker  . Smokeless tobacco: Not on file  . Alcohol Use: Yes    Review of Systems  All other systems reviewed and are negative.     Allergies  Codeine and Sulfa antibiotics  Home Medications   Prior to Admission medications   Medication Sig Start Date End Date Taking? Authorizing Provider  HYDROcodone-acetaminophen (NORCO/VICODIN) 5-325 MG per tablet Take 2 tablets by mouth every 4 (four) hours as needed. 12/11/13   Glynn OctaveStephen Rancour, MD  ibuprofen (ADVIL,MOTRIN) 800 MG tablet Take 1 tablet (800 mg total) by mouth 3 (three) times daily. 01/20/14   Teressa LowerVrinda Dontavius Keim, NP   BP 124/60 mmHg  Pulse 80  Temp(Src) 98.9 F (37.2 C) (Oral)  Resp 18  Ht 6' (1.829 m)  Wt 265 lb (120.203 kg)  BMI 35.93 kg/m2  SpO2 97% Physical Exam  Constitutional: He is oriented to person, place, and time. He appears well-developed and well-nourished.  Cardiovascular: Normal rate and regular rhythm.   Pulmonary/Chest: Effort normal and breath sounds normal.  Musculoskeletal:  Mild swelling noted to the lateral elbow. Pt has full rom. Pulses intact   Neurological: He is alert and oriented to person, place, and time.  Nursing note and vitals reviewed.   ED Course  Procedures (including critical care time) Labs Review Labs Reviewed - No data to display  Imaging Review Dg Elbow Complete Left  01/20/2014   CLINICAL DATA:  Left elbow pain, a  box fell on his arm  EXAM: LEFT ELBOW - COMPLETE 3+ VIEW  COMPARISON:  None.  FINDINGS: Four views of left elbow submitted. No acute fracture or subluxation. No posterior fat pad sign.  IMPRESSION: Negative.   Electronically Signed   By: Natasha MeadLiviu  Pop M.D.   On: 01/20/2014 14:01     EKG Interpretation None      MDM   Final diagnoses:  Contusion   No bony abnormality noted. Discussed symptomatic relief at home    Teressa LowerVrinda Xiao Graul, NP 01/20/14 1501  Benny LennertJoseph L Zammit, MD 01/20/14 1616

## 2015-12-09 IMAGING — CT CT ABD-PELV W/ CM
2 of 4 series · 17 of 46 positions shown, 19 images · IV contrast (omnipaque)
Comparison: 07/28/2013

CLINICAL DATA: Right upper quadrant pain after injury. Patient was
having pain in the same location prior to the injury but the injury
worsened the pain.

EXAM:
CT ABDOMEN AND PELVIS WITH CONTRAST
TECHNIQUE: Multidetector CT imaging of the abdomen and pelvis was performed
using the standard protocol following bolus administration of
intravenous contrast.
CONTRAST:  25mL OMNIPAQUE IOHEXOL 300 MG/ML SOLN, 100mL OMNIPAQUE
IOHEXOL 300 MG/ML SOLN

[Series 2: abd_pel_with 5.0 b40f · axial · 0.95mm/px · z∈[-562,-122]mm · 14 of 97 slices shown, 16 images]
[im 5/97  soft-tissue]
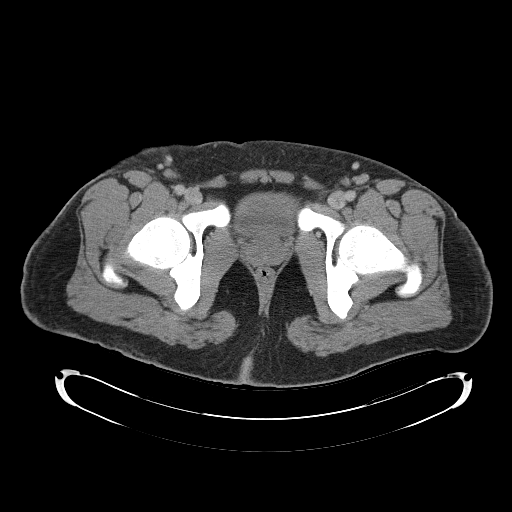
[im 5/97  bone]
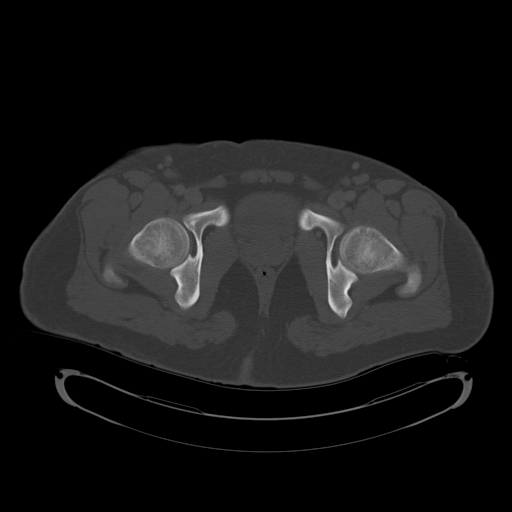
[im 13/97  soft-tissue]
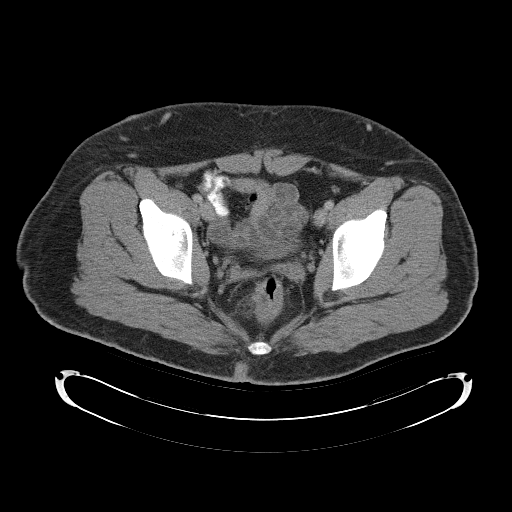
[im 21/97  soft-tissue]
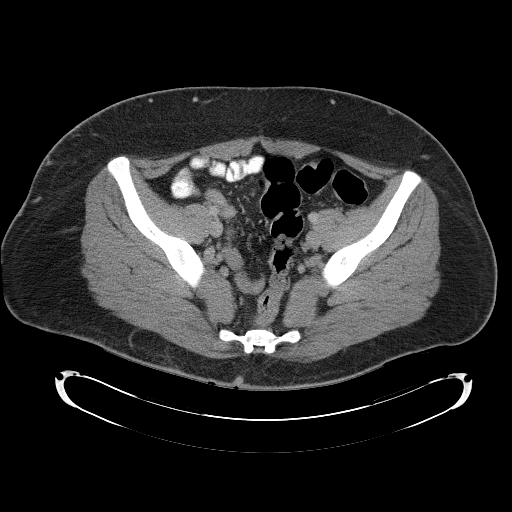
[im 25/97  soft-tissue]
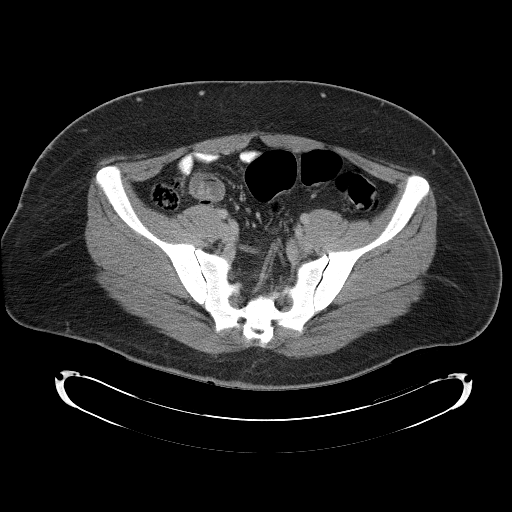
[im 33/97  soft-tissue]
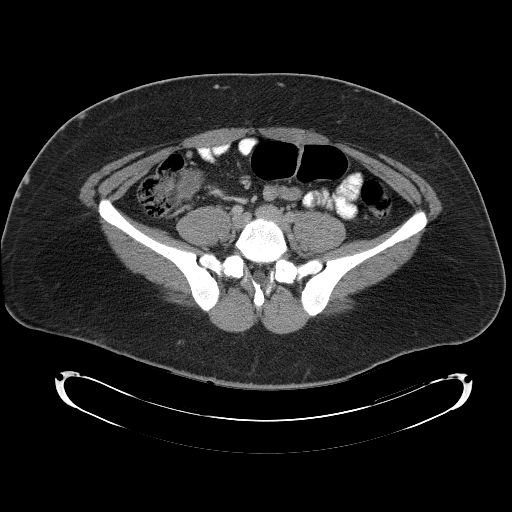
[im 41/97  soft-tissue]
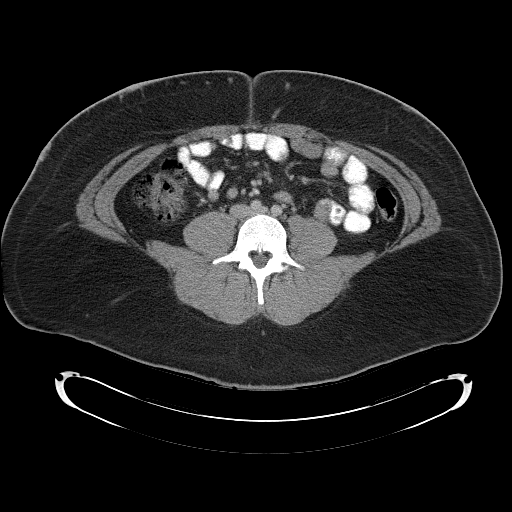
[im 45/97  soft-tissue]
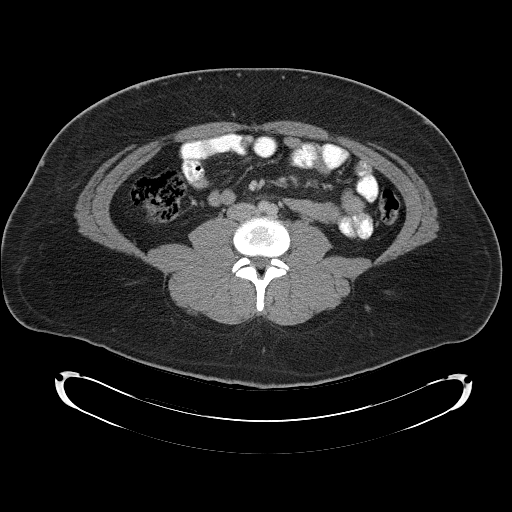
[im 53/97  soft-tissue]
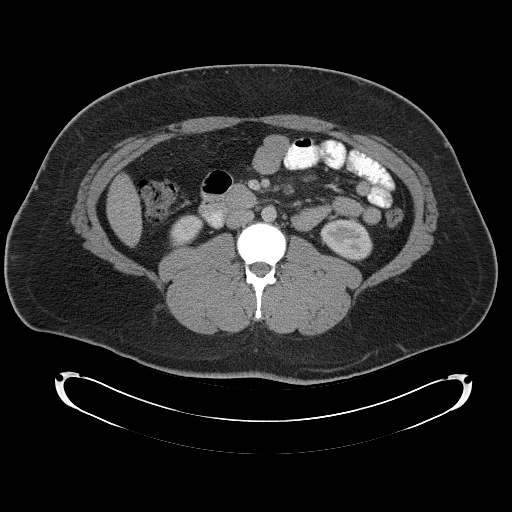
[im 57/97  soft-tissue]
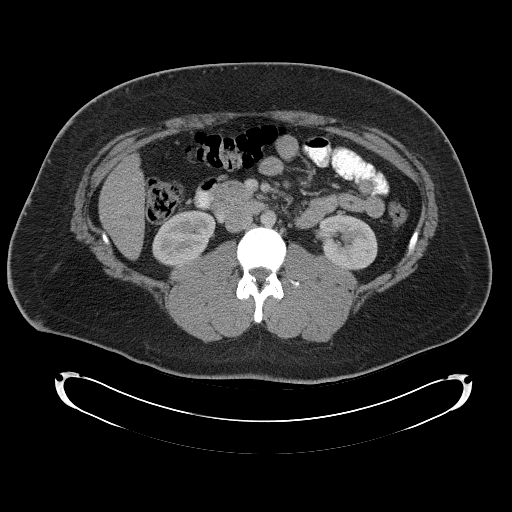
[im 57/97  bone]
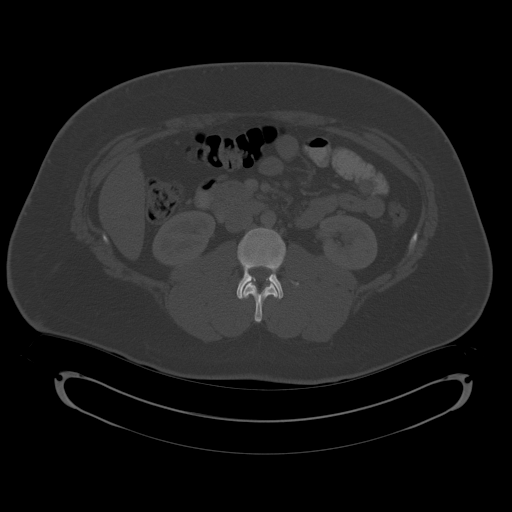
[im 65/97  soft-tissue]
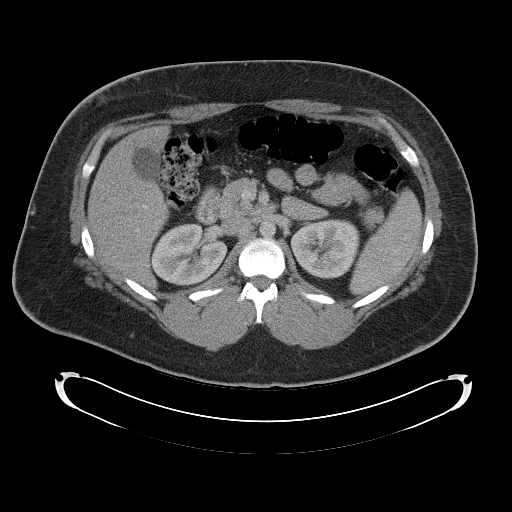
[im 73/97  soft-tissue]
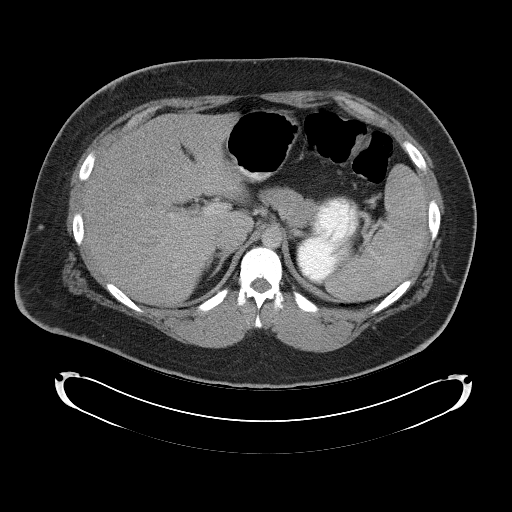
[im 77/97  soft-tissue]
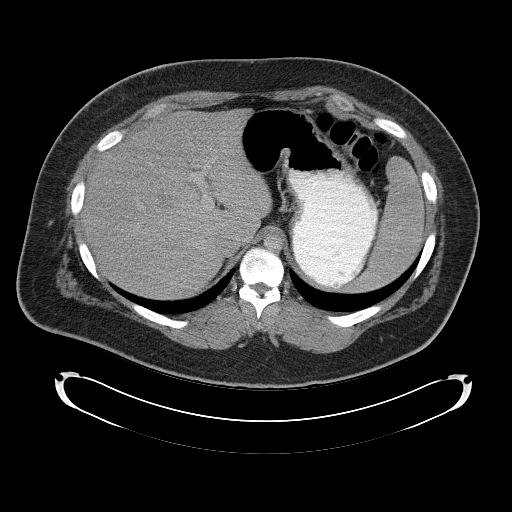
[im 85/97  soft-tissue]
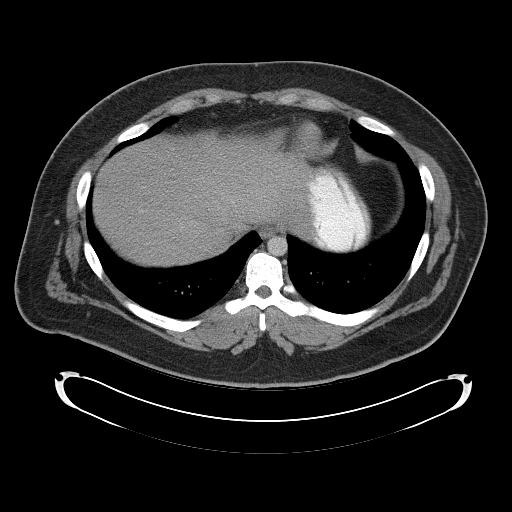
[im 93/97  soft-tissue]
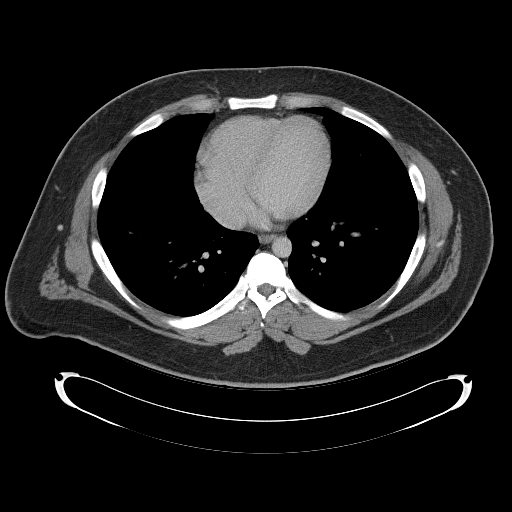

[Series 3: abd_pel_with 3.0 spo cor · coronal · 0.85mm/px · 3 of 104 slices shown]
[im 35/104  soft-tissue]
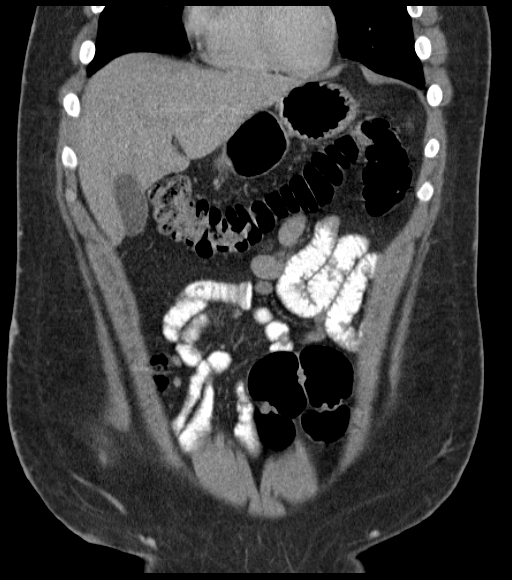
[im 46/104  soft-tissue]
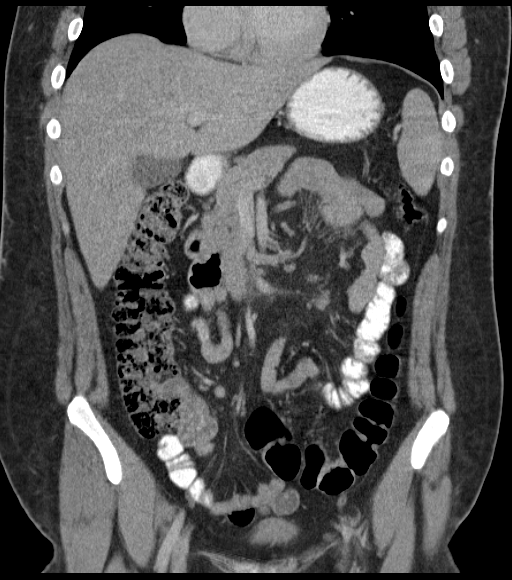
[im 58/104  soft-tissue]
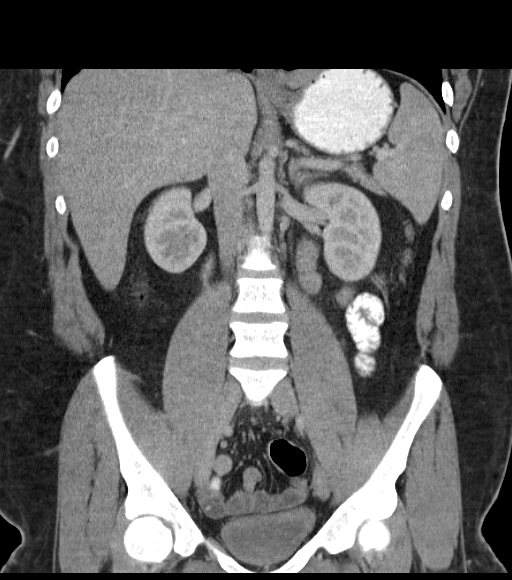

[17 of 46 positions shown; findings below may reference images not displayed]

FINDINGS: Lung bases are clear.

The liver, spleen, gallbladder, pancreas, adrenal glands, kidneys,
abdominal aorta, inferior vena cava, and retroperitoneal lymph nodes
are unremarkable. Stomach, small bowel, and colon appear normal for
degree of distention. No free air or free fluid in the abdomen.
Abdominal wall musculature appears intact.

Pelvis: Appendix is normal. No evidence of diverticulitis. Bladder
is decompressed. No free or loculated pelvic fluid collections. No
pelvic mass or lymphadenopathy. Normal alignment of the lumbar
spine. No vertebral compression deformities. No displaced rib,
pelvic, sacral, or hip fractures identified.
IMPRESSION: No acute posttraumatic changes demonstrated in the abdomen or
pelvis.

## 2016-08-25 ENCOUNTER — Encounter (HOSPITAL_COMMUNITY): Payer: Self-pay | Admitting: Emergency Medicine

## 2016-08-25 ENCOUNTER — Emergency Department (HOSPITAL_COMMUNITY): Payer: Self-pay

## 2016-08-25 ENCOUNTER — Emergency Department (HOSPITAL_COMMUNITY)
Admission: EM | Admit: 2016-08-25 | Discharge: 2016-08-25 | Disposition: A | Discharge status: Home / Self Care | Payer: Self-pay | Attending: Emergency Medicine | Admitting: Emergency Medicine

## 2016-08-25 DIAGNOSIS — F17200 Nicotine dependence, unspecified, uncomplicated: Secondary | ICD-10-CM | POA: Insufficient documentation

## 2016-08-25 DIAGNOSIS — R1010 Upper abdominal pain, unspecified: Secondary | ICD-10-CM | POA: Insufficient documentation

## 2016-08-25 DIAGNOSIS — J45909 Unspecified asthma, uncomplicated: Secondary | ICD-10-CM | POA: Insufficient documentation

## 2016-08-25 LAB — D-DIMER, QUANTITATIVE: D-Dimer, Quant: <0.27 ug/mL-FEU (ref 0.00–0.50)

## 2016-08-25 LAB — COMPREHENSIVE METABOLIC PANEL
ALT: 61 U/L (ref 17–63)
ANION GAP: 11 (ref 5–15)
AST: 35 U/L (ref 15–41)
Albumin: 4.6 g/dL (ref 3.5–5.0)
Alkaline Phosphatase: 75 U/L (ref 38–126)
BUN: 17 mg/dL (ref 6–20)
CALCIUM: 9 mg/dL (ref 8.9–10.3)
CHLORIDE: 106 mmol/L (ref 101–111)
CO2: 23 mmol/L (ref 22–32)
Creatinine, Ser: 0.97 mg/dL (ref 0.61–1.24)
Glucose, Bld: 85 mg/dL (ref 65–99)
Potassium: 4 mmol/L (ref 3.5–5.1)
Sodium: 140 mmol/L (ref 135–145)
Total Bilirubin: 0.7 mg/dL (ref 0.3–1.2)
Total Protein: 7.3 g/dL (ref 6.5–8.1)

## 2016-08-25 LAB — CBC WITH DIFFERENTIAL/PLATELET
Basophils Absolute: 0 10*3/uL (ref 0.0–0.1)
Basophils Relative: 0 %
EOS ABS: 0.4 10*3/uL (ref 0.0–0.7)
Eosinophils Relative: 5 %
HCT: 47.3 % (ref 39.0–52.0)
Hemoglobin: 16.6 g/dL (ref 13.0–17.0)
LYMPHS PCT: 39 %
Lymphs Abs: 2.9 10*3/uL (ref 0.7–4.0)
MCH: 31.5 pg (ref 26.0–34.0)
MCHC: 35.1 g/dL (ref 30.0–36.0)
MCV: 89.8 fL (ref 78.0–100.0)
MONO ABS: 0.5 10*3/uL (ref 0.1–1.0)
MONOS PCT: 6 %
Neutro Abs: 3.7 10*3/uL (ref 1.7–7.7)
Neutrophils Relative %: 50 %
Platelets: 245 10*3/uL (ref 150–400)
RBC: 5.27 MIL/uL (ref 4.22–5.81)
RDW: 13.2 % (ref 11.5–15.5)
WBC: 7.4 10*3/uL (ref 4.0–10.5)

## 2016-08-25 LAB — URINALYSIS, ROUTINE W REFLEX MICROSCOPIC
Bilirubin Urine: NEGATIVE
GLUCOSE, UA: NEGATIVE mg/dL
HGB URINE DIPSTICK: NEGATIVE
Ketones, ur: NEGATIVE mg/dL
NITRITE: NEGATIVE
Protein, ur: NEGATIVE mg/dL
SPECIFIC GRAVITY, URINE: 1.028 (ref 1.005–1.030)
pH: 5 (ref 5.0–8.0)

## 2016-08-25 LAB — LIPASE, BLOOD: Lipase: 27 U/L (ref 11–51)

## 2016-08-25 LAB — TROPONIN I: Troponin I: <0.03 ng/mL (ref <0.03)

## 2016-08-25 MED ORDER — OMEPRAZOLE 20 MG PO CPDR: 20.0000 mg | DELAYED_RELEASE_CAPSULE | Freq: Every day | ORAL | 0 refills | Status: DC

## 2016-08-25 MED ORDER — GI COCKTAIL ~~LOC~~
30.0000 mL | Freq: Once | ORAL | Status: AC
  Administered 2016-08-25: 30 mL via ORAL
  Filled 2016-08-25: qty 30

## 2016-08-25 MED ORDER — IOPAMIDOL (ISOVUE-300) INJECTION 61%
100.0000 mL | Freq: Once | INTRAVENOUS | Status: AC | PRN
  Administered 2016-08-25: 100 mL via INTRAVENOUS

## 2016-08-25 MED ORDER — ONDANSETRON HCL 4 MG/2ML IJ SOLN
4.0000 mg | Freq: Once | INTRAMUSCULAR | Status: AC
  Administered 2016-08-25: 4 mg via INTRAVENOUS
  Filled 2016-08-25: qty 2

## 2016-08-25 MED ORDER — FENTANYL CITRATE (PF) 100 MCG/2ML IJ SOLN
50.0000 ug | Freq: Once | INTRAMUSCULAR | Status: AC
  Administered 2016-08-25: 50 ug via INTRAVENOUS
  Filled 2016-08-25: qty 2

## 2016-08-25 NOTE — ED Notes (Signed)
Topaz signature not working. Pt ambulatory to waiting room. Pt verbalized understanding of discharge instructions.

## 2016-08-25 NOTE — ED Provider Notes (Signed)
[facility]-EMER[facility]ENCY DEPT Provider Note   CSN: [id number] Arrival date & time: 08/25/16  0151     History   Chief Complaint Chief Complaint  Patient presents with  . Abdominal Pain    HPI Alexander Rivas is a 26 y.o. male.  Patient with 5 days of upper abdominal pain that radiates to the left side. She reports the pain is constant, worse with eating. He still wants to eat. Has had nausea but no vomiting. Denies any constipation. Denies any fever. Pain is worse when he tries to take a deep breath. States when he walks around he has diffuse abdominal pain is making it difficult for him to do his job. Denies any history of acid reflux or ulcers. Has been having heartburn more recently. Denies excessive alcohol or NSAID use. No previous abdominal surgeries.   The history is provided by the patient.  Abdominal Pain   Associated symptoms include nausea and constipation. Pertinent negatives include fever, vomiting, dysuria, hematuria, arthralgias and myalgias.    Past Medical History:  Diagnosis Date  . Asthma     There are no active problems to display for this patient.   Past Surgical History:  Procedure Laterality Date  . KNEE SURGERY         Home Medications    Prior to Admission medications   Medication Sig Start Date End Date Taking? Authorizing Provider  HYDROcodone-acetaminophen (NORCO/VICODIN) 5-325 MG per tablet Take 2 tablets by mouth every 4 (four) hours as needed. 12/11/13   Juanangel Soderholm, Jeannett Senior, MD  ibuprofen (ADVIL,MOTRIN) 800 MG tablet Take 1 tablet (800 mg total) by mouth 3 (three) times daily. 01/20/14   Teressa Lower, NP    Family History No family history on file.  Social History Social History  Substance Use Topics  . Smoking status: Current Every Day Smoker  . Smokeless tobacco: Never Used  . Alcohol use Yes     Allergies   Codeine and Sulfa antibiotics   Review of Systems Review of Systems  Constitutional: Positive for activity change and  appetite change. Negative for fever.  HENT: Negative for congestion and rhinorrhea.   Respiratory: Negative for cough, chest tightness and shortness of breath.   Cardiovascular: Negative for chest pain.  Gastrointestinal: Positive for abdominal pain, constipation and nausea. Negative for vomiting.  Genitourinary: Negative for dysuria, hematuria and testicular pain.  Musculoskeletal: Negative for arthralgias, joint swelling and myalgias.  Skin: Negative for rash.  Neurological: Negative for dizziness and weakness.   all other systems are negative except as noted in the HPI and PMH.     Physical Exam Updated Vital Signs BP 140/85 (BP Location: Left Arm)   Pulse 93   Temp 98.3 F (36.8 C) (Oral)   Resp 18   SpO2 98%   Physical Exam  Constitutional: He is oriented to person, place, and time. He appears well-developed and well-nourished. No distress.  HENT:  Head: Normocephalic and atraumatic.  Mouth/Throat: Oropharynx is clear and moist. No oropharyngeal exudate.  Eyes: Conjunctivae and EOM are normal. Pupils are equal, round, and reactive to light.  Neck: Normal range of motion. Neck supple.  No meningismus.  Cardiovascular: Normal rate, regular rhythm, normal heart sounds and intact distal pulses.   No murmur heard. Pulmonary/Chest: Effort normal and breath sounds normal. No respiratory distress.  Abdominal: Soft. There is tenderness. There is no rebound and no guarding.  Obese. Diffuse upper abdominal tenderness. No guarding or rebound  Musculoskeletal: Normal range of motion. He exhibits no edema  or tenderness.  Neurological: He is alert and oriented to person, place, and time. No cranial nerve deficit. He exhibits normal muscle tone. Coordination normal.   5/5 strength throughout. CN 2-12 intact.Equal grip strength.   Skin: Skin is warm.  Psychiatric: He has a normal mood and affect. His behavior is normal.  Nursing note and vitals reviewed.    ED Treatments / Results    Labs (all labs ordered are listed, but only abnormal results are displayed) Labs Reviewed  URINALYSIS, ROUTINE W REFLEX MICROSCOPIC - Abnormal; Notable for the following:       Result Value   Leukocytes, UA SMALL (*)    Bacteria, UA RARE (*)    Squamous Epithelial / LPF 0-5 (*)    All other components within normal limits  CBC WITH DIFFERENTIAL/PLATELET  COMPREHENSIVE METABOLIC PANEL  LIPASE, BLOOD  D-DIMER, QUANTITATIVE (NOT AT [facility])  TROPONIN I    EKG  EKG Interpretation None       Radiology Ct Abdomen Pelvis W Contrast  Result Date: 08/25/2016 CLINICAL DATA:  Sharp mid abdominal pain.  Constipation. EXAM: CT ABDOMEN AND PELVIS WITH CONTRAST TECHNIQUE: Multidetector CT imaging of the abdomen and pelvis was performed using the standard protocol following bolus administration of intravenous contrast. CONTRAST:  100mL ISOVUE-300 IOPAMIDOL (ISOVUE-300) INJECTION 61% COMPARISON:  CT abdomen pelvis 12/11/2013 FINDINGS: Lower chest: No pulmonary nodules. No visible pleural or pericardial effusion. Hepatobiliary: Normal hepatic size and contours without focal liver lesion. No perihepatic ascites. No intra- or extrahepatic biliary dilatation. Normal gallbladder. Pancreas: Normal pancreatic contours and enhancement. No peripancreatic fluid collection or pancreatic ductal dilatation. Spleen: Normal. Adrenals/Urinary Tract: Normal adrenal glands. No hydronephrosis or solid renal mass. Stomach/Bowel: There is no hiatal hernia. The stomach and duodenum are normal. There is no dilated small bowel or enteric inflammation. There is no colonic abnormality. The appendix is normal. Vascular/Lymphatic: Normal course and caliber of the major abdominal vessels. No abdominal or pelvic adenopathy. Reproductive: Normal prostate and seminal vesicles. Musculoskeletal: No lytic or blastic osseous lesion. Normal visualized extrathoracic and extraperitoneal soft tissues. Other: No contributory non-categorized  findings. IMPRESSION: No acute abnormality of the abdomen or pelvis. Electronically Signed   By: Deatra RobinsonKevin  Herman M.D.   On: 08/25/2016 04:51    Procedures Procedures (including critical care time)  Medications Ordered in ED Medications  ondansetron (ZOFRAN) injection 4 mg (4 mg Intravenous Given 08/25/16 0259)  fentaNYL (SUBLIMAZE) injection 50 mcg (50 mcg Intravenous Given 08/25/16 0259)     Initial Impression / Assessment and Plan / ED Course  I have reviewed the triage vital signs and the nursing notes.  Pertinent labs & imaging results that were available during my care of the patient were reviewed by me and considered in my medical decision making (see chart for details).     5 days progressively worsening upper abdominal pain with nausea and pain with breathing.  LFTs and lipase are normal. D-dimer negative. Doubt pulmonary embolism.  CT scan is negative. Patient without acute abnormality. He is tolerating by mouth.  We'll treat for gastritis and esophagitis. Start PPI. Patient to avoid alcohol, NSAIDs, caffeine, spicy foods. Follow-up with gastroenterology. Return precautions discussed.  Final Clinical Impressions(s) / ED Diagnoses   Final diagnoses:  Upper abdominal pain    New Prescriptions New Prescriptions   No medications on file     Glynn Octaveancour, Vaeda Westall, MD 08/25/16 [phone]

## 2016-08-25 NOTE — ED Notes (Signed)
Patient transported to CT 

## 2016-08-25 NOTE — ED Triage Notes (Signed)
Pt C/O sharp mid abdominal pain. Pt states he has been feeling constipated and hurts when he takes a deep breath.

## 2016-08-25 NOTE — Discharge Instructions (Signed)
Take the stomach medication as prescribed. Avoid alcohol, NSAIDs, caffeine, spicy foods. Follow up with the stomach doctor. Return to the ED if you develop new or worsening symptoms.

## 2016-08-31 ENCOUNTER — Encounter: Payer: Self-pay | Admitting: Internal Medicine

## 2016-10-26 ENCOUNTER — Ambulatory Visit: Payer: Self-pay | Admitting: Gastroenterology

## 2016-10-26 ENCOUNTER — Telehealth: Payer: Self-pay | Admitting: Gastroenterology

## 2016-10-26 ENCOUNTER — Encounter: Payer: Self-pay | Admitting: Gastroenterology

## 2016-10-26 NOTE — Telephone Encounter (Signed)
Patient was a no show and letter sent  °

## 2021-08-31 ENCOUNTER — Encounter: Payer: Self-pay | Admitting: *Deleted

## 2021-09-12 ENCOUNTER — Ambulatory Visit: Payer: Medicaid Other | Admitting: Urology

## 2021-09-12 NOTE — Progress Notes (Deleted)
   Assessment: 1. Scrotal pain      Plan: ***  Chief Complaint: No chief complaint on file.   History of Present Illness:  Alexander Rivas is a 31 y.o. year old male who is seen in consultation from [place], Cassell Clement, FNP for evaluation of left testicular discomfort and swelling.   Past Medical History:  Past Medical History:  Diagnosis Date   Asthma     Past Surgical History:  Past Surgical History:  Procedure Laterality Date   KNEE SURGERY      Allergies:  Allergies  Allergen Reactions   Codeine     Father is allergic-never taken due to history   Sulfa Antibiotics Rash    Family History:  No family history on file.  Social History:  Social History   Tobacco Use   Smoking status: Every Day   Smokeless tobacco: Never  Substance Use Topics   Alcohol use: Yes   Drug use: Yes    Types: Marijuana    Review of symptoms:  Constitutional:  Negative for unexplained weight loss, night sweats, fever, chills ENT:  Negative for nose bleeds, sinus pain, painful swallowing CV:  Negative for chest pain, shortness of breath, exercise intolerance, palpitations, loss of consciousness Resp:  Negative for cough, wheezing, shortness of breath GI:  Negative for nausea, vomiting, diarrhea, bloody stools GU:  Positives noted in HPI; otherwise negative for gross hematuria, dysuria, urinary incontinence Neuro:  Negative for seizures, poor balance, limb weakness, slurred speech Psych:  Negative for lack of energy, depression, anxiety Endocrine:  Negative for polydipsia, polyuria, symptoms of hypoglycemia (dizziness, hunger, sweating) Hematologic:  Negative for anemia, purpura, petechia, prolonged or excessive bleeding, use of anticoagulants  Allergic:  Negative for difficulty breathing or choking as a result of exposure to anything; no shellfish allergy; no allergic response (rash/itch) to materials, foods  Physical exam: There were no vitals taken for this visit. GENERAL  APPEARANCE:  Well appearing, well developed, well nourished, NAD HEENT: Atraumatic, Normocephalic, oropharynx clear. NECK: Supple without lymphadenopathy or thyromegaly. LUNGS: Clear to auscultation bilaterally. HEART: Regular Rate and Rhythm without murmurs, gallops, or rubs. ABDOMEN: Soft, non-tender, No Masses. EXTREMITIES: Moves all extremities well.  Without clubbing, cyanosis, or edema. NEUROLOGIC:  Alert and oriented x 3, normal gait, CN II-XII grossly intact.  MENTAL STATUS:  Appropriate. BACK:  Non-tender to palpation.  No CVAT SKIN:  Warm, dry and intact.   GU: Penis:  {Exam; penis:5791} Meatus: {Meatus:15530} Scrotum: {pe scrotum:310183} Testis: {Exam; testicles:5790} Epididymis: {epididymis JPVG:681594} Prostate: {Exam; prostate:5793} Rectum: {rectal exam:26517}   Results: No results found for this or any previous visit (from the past 24 hour(s)).

## 2021-09-13 ENCOUNTER — Ambulatory Visit: Payer: Medicaid Other | Admitting: Orthopedic Surgery

## 2021-09-13 ENCOUNTER — Encounter: Payer: Self-pay | Admitting: Orthopedic Surgery

## 2021-09-14 ENCOUNTER — Ambulatory Visit: Payer: Self-pay | Admitting: Orthopedic Surgery

## 2021-09-22 ENCOUNTER — Ambulatory Visit (INDEPENDENT_AMBULATORY_CARE_PROVIDER_SITE_OTHER): Payer: Medicaid Other | Admitting: Urology

## 2021-09-22 ENCOUNTER — Encounter: Payer: Self-pay | Admitting: Urology

## 2021-09-22 VITALS — BP 123/80 | HR 77 | Ht 73.0 in | Wt 325.0 lb

## 2021-09-22 DIAGNOSIS — N503 Cyst of epididymis: Secondary | ICD-10-CM

## 2021-09-22 NOTE — Progress Notes (Signed)
Assessment: 1. Epididymal cyst, left     Plan: The patient's exam is consistent with a benign epididymal cyst.  Discussed the diagnosis with the patient today.  We discussed further evaluation with a scrotal ultrasound.  He would like to monitor his symptoms at this time. I recommended a short course of anti-inflammatory medication if he has return of his scrotal discomfort. Return to office prn  Chief Complaint:  Chief Complaint  Patient presents with   Testicle Pain    History of Present Illness:  Alexander Rivas is a 31 y.o. year old male who is seen in consultation from Blanco, FNP for evaluation of left scrotal discomfort.  He was diagnosed with a apparent epididymal cyst as a teenager.  Within the past month, he has had some intermittent discomfort in the left scrotal area.  It has not been severe.  He did notice discomfort during intercourse.  No history of scrotal trauma.  He has not felt any change in the scrotal contents.  In the size of the previously noted mass in the upper left scrotum.  No recent imaging.  No urinary symptoms.  No dysuria or gross hematuria.   Past Medical History:  Past Medical History:  Diagnosis Date   Asthma    Depression     Past Surgical History:  Past Surgical History:  Procedure Laterality Date   KNEE SURGERY      Allergies:  Allergies  Allergen Reactions   Codeine     Father is allergic-never taken due to history   Sulfa Antibiotics Rash    Family History:  History reviewed. No pertinent family history.  Social History:  Social History   Tobacco Use   Smoking status: Former    Types: Cigarettes   Smokeless tobacco: Never  Building services engineer Use: Every day  Substance Use Topics   Alcohol use: Yes   Drug use: Yes    Types: Marijuana    Review of symptoms:  Constitutional:  Negative for unexplained weight loss, night sweats, fever, chills ENT:  Negative for nose bleeds, sinus pain, painful  swallowing CV:  Negative for chest pain, shortness of breath, exercise intolerance, palpitations, loss of consciousness Resp:  Negative for cough, wheezing, shortness of breath GI:  Negative for nausea, vomiting, diarrhea, bloody stools GU:  Positives noted in HPI; otherwise negative for gross hematuria, dysuria, urinary incontinence Neuro:  Negative for seizures, poor balance, limb weakness, slurred speech Psych:  Negative for lack of energy, depression, anxiety Endocrine:  Negative for polydipsia, polyuria, symptoms of hypoglycemia (dizziness, hunger, sweating) Hematologic:  Negative for anemia, purpura, petechia, prolonged or excessive bleeding, use of anticoagulants  Allergic:  Negative for difficulty breathing or choking as a result of exposure to anything; no shellfish allergy; no allergic response (rash/itch) to materials, foods  Physical exam: BP 123/80   Pulse 77   Ht 6\' 1"  (1.854 m)   Wt (!) 325 lb (147.4 kg)   BMI 42.88 kg/m  GENERAL APPEARANCE:  Well appearing, well developed, well nourished, NAD HEENT: Atraumatic, Normocephalic, oropharynx clear. NECK: Supple without lymphadenopathy or thyromegaly. LUNGS: Clear to auscultation bilaterally. HEART: Regular Rate and Rhythm without murmurs, gallops, or rubs. ABDOMEN: Soft, non-tender, No Masses. EXTREMITIES: Moves all extremities well.  Without clubbing, cyanosis, or edema. NEUROLOGIC:  Alert and oriented x 3, normal gait, CN II-XII grossly intact.  MENTAL STATUS:  Appropriate. BACK:  Non-tender to palpation.  No CVAT SKIN:  Warm, dry and intact.   GU:  Penis:  circumcised Meatus: Normal Scrotum: no edema or erythema Testis: normal without masses bilateral Epididymis: right, normal; left, cystic structure at epididymal head   Results: None

## 2021-09-23 LAB — URINALYSIS, ROUTINE W REFLEX MICROSCOPIC
Bilirubin, UA: NEGATIVE
Glucose, UA: NEGATIVE
Ketones, UA: NEGATIVE
Leukocytes,UA: NEGATIVE
Nitrite, UA: NEGATIVE
Protein,UA: NEGATIVE
RBC, UA: NEGATIVE
Specific Gravity, UA: 1.025 (ref 1.005–1.030)
Urobilinogen, Ur: 1 mg/dL (ref 0.2–1.0)
pH, UA: 6 (ref 5.0–7.5)

## 2022-04-20 ENCOUNTER — Encounter: Payer: Self-pay | Admitting: Radiology

## 2022-12-18 ENCOUNTER — Encounter (INDEPENDENT_AMBULATORY_CARE_PROVIDER_SITE_OTHER): Payer: Self-pay | Admitting: *Deleted

## 2022-12-25 ENCOUNTER — Ambulatory Visit (INDEPENDENT_AMBULATORY_CARE_PROVIDER_SITE_OTHER): Payer: Medicaid Other | Admitting: Gastroenterology

## 2022-12-25 ENCOUNTER — Encounter (INDEPENDENT_AMBULATORY_CARE_PROVIDER_SITE_OTHER): Payer: Self-pay | Admitting: *Deleted

## 2022-12-26 ENCOUNTER — Telehealth: Payer: Self-pay | Admitting: Gastroenterology

## 2022-12-26 NOTE — Telephone Encounter (Signed)
Patient was a no show for MAIN yesterday, he left message that he wanted to reschedule. [phone]

## 2023-02-27 ENCOUNTER — Ambulatory Visit (INDEPENDENT_AMBULATORY_CARE_PROVIDER_SITE_OTHER): Payer: Medicaid Other | Admitting: Gastroenterology

## 2023-02-27 ENCOUNTER — Encounter (INDEPENDENT_AMBULATORY_CARE_PROVIDER_SITE_OTHER): Payer: Self-pay | Admitting: Gastroenterology

## 2023-02-27 VITALS — BP 126/84 | HR 79 | Temp 97.9°F | Ht 72.0 in | Wt 342.8 lb

## 2023-02-27 DIAGNOSIS — R1319 Other dysphagia: Secondary | ICD-10-CM | POA: Insufficient documentation

## 2023-02-27 DIAGNOSIS — Z6841 Body Mass Index (BMI) 40.0 and over, adult: Secondary | ICD-10-CM

## 2023-02-27 DIAGNOSIS — K5900 Constipation, unspecified: Secondary | ICD-10-CM | POA: Diagnosis not present

## 2023-02-27 DIAGNOSIS — R1310 Dysphagia, unspecified: Secondary | ICD-10-CM | POA: Diagnosis not present

## 2023-02-27 DIAGNOSIS — K219 Gastro-esophageal reflux disease without esophagitis: Secondary | ICD-10-CM | POA: Insufficient documentation

## 2023-02-27 DIAGNOSIS — R7401 Elevation of levels of liver transaminase levels: Secondary | ICD-10-CM | POA: Diagnosis not present

## 2023-02-27 DIAGNOSIS — E66813 Obesity, class 3: Secondary | ICD-10-CM

## 2023-02-27 DIAGNOSIS — K5904 Chronic idiopathic constipation: Secondary | ICD-10-CM | POA: Insufficient documentation

## 2023-02-27 DIAGNOSIS — R748 Abnormal levels of other serum enzymes: Secondary | ICD-10-CM | POA: Insufficient documentation

## 2023-02-27 DIAGNOSIS — K760 Fatty (change of) liver, not elsewhere classified: Secondary | ICD-10-CM | POA: Insufficient documentation

## 2023-02-27 MED ORDER — PSYLLIUM 58.6 % PO PACK: 1.0000 | PACK | Freq: Two times a day (BID) | ORAL | 2 refills | Status: AC

## 2023-02-27 NOTE — Progress Notes (Signed)
 Massie Mees Faizan Shalom Ware , M.D. Gastroenterology & Hepatology [facility] [address] [place], [place] [address] Primary Care Physician: Wileen Roxanna POUR, DO [address] [place] [address]  Chief Complaint:  GERD, Dysphagia, Abdominal pain , Elevated liver enzymes   History of Present Illness: Alexander Rivas is a 33 y.o. male with depression , anxiety, asthma who presents for evaluation of GERD, Dysphagia, Abdominal pain , Elevated liver enzymes .  Patient reports he is completely insensate Past 6 years which are progressive now.  Reports indigestion which she explains as upper abdominal discomfort with without any relieving or aggravating factors.  Patient reports solid food dysphagia for many years and reports it runs in the family.  Patient took omeprazole  but reports that he had some sort of mental breakdown after taking that and was switched to pantoprazole which did help his symptoms recurred after discontinuing The patient denies having any nausea, vomiting, fever, chills, hematochezia, melena, hematemesis, abdominal distention, abdominal pain, diarrhea, jaundice, pruritus or weight loss.  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 No pertinent family history.  Social History: Social History   Tobacco Use  Smoking Status Former   Types: E-cigarettes   Passive exposure: Current  Smokeless Tobacco Never   Social History   Substance and Sexual Activity  Alcohol Use Not Currently   Comment: about once  a year   Social History   Substance and Sexual Activity  Drug Use Yes   Types: Marijuana    Allergies: Allergies  Allergen Reactions   Codeine     Father is allergic-never taken due to history   Other     Bamboo   Sulfa Antibiotics Rash    Medications: Current Outpatient Medications  Medication Sig Dispense Refill   albuterol (PROVENTIL) (2.5 MG/3ML) 0.083% nebulizer solution Take 2.5 mg by nebulization every 6 (six) hours as needed for wheezing or shortness of breath.     psyllium (METAMUCIL) 58.6 % packet Take 1 packet by mouth 2 (two) times daily. 60 packet 2   No current facility-administered medications for this visit.    Review of Systems: GENERAL: negative for malaise, night sweats HEENT: No changes in hearing or vision, no nose bleeds or other nasal problems. NECK: Negative for lumps, goiter, pain and significant neck swelling RESPIRATORY: Negative for cough, wheezing CARDIOVASCULAR: Negative for chest pain, leg swelling, palpitations, orthopnea GI: SEE HPI MUSCULOSKELETAL: Negative for joint pain or swelling, back pain, and muscle pain. SKIN: Negative for lesions, rash HEMATOLOGY Negative for prolonged bleeding, bruising easily, and swollen nodes. ENDOCRINE: Negative for cold or heat intolerance, polyuria, polydipsia and goiter. NEURO: negative for tremor, gait imbalance, syncope and seizures. The remainder of the review of systems is noncontributory.   Physical Exam: BP 126/84   Pulse 79   Temp 97.9 F (36.6 C)   Ht 6' (1.829 m)   Wt (!) 342 lb 12.8 oz (155.5 kg)   BMI 46.49 kg/m  GENERAL: The patient is AO x3, in no acute distress. HEENT: Head is normocephalic and atraumatic. EOMI are intact. Mouth  is well hydrated and without lesions. NECK: Supple. No masses LUNGS: Clear to auscultation. No presence of rhonchi/wheezing/rales. Adequate chest expansion HEART: RRR, normal s1 and s2. ABDOMEN: Soft, nontender, no guarding, no peritoneal signs, and  nondistended. BS +. No masses.    Imaging/Labs: as above     Latest Ref Rng & Units 08/25/2016    3:00 AM 12/22/2013    7:49 PM 12/11/2013   12:30 AM  CBC  WBC 4.0 - 10.5 K/uL 7.4  7.7  7.7   Hemoglobin 13.0 - 17.0 g/dL 83.3  84.1  84.3   Hematocrit 39.0 - 52.0 % 47.3  46.9  45.4   Platelets 150 - 400 K/uL 245  269  331    No results found for: IRON, TIBC, FERRITIN  I personally reviewed and interpreted the available labs, imaging and endoscopic files.  CT Abdomen and pelvis 2018   IMPRESSION: No acute abnormality of the abdomen or pelvis.   Labs from 2023 TSH 2.9 hemoglobin 16.9 platelet 208  ALT 55 AST 28 T. Bili ALT 28 2022  Follow-up  A1c 5.5 Impression and Plan:  Alexander Rivas is a 33 y.o. male with depression , anxiety, asthma who presents for evaluation of GERD, Dysphagia, Abdominal pain , Elevated liver enzymes .  #Dyspepsia #Dysphagia #Refractory GERD  Patient has longstanding history of solid food dysphagia with history of atopy(asthma) and also reports family history of dysphagia.  Will recommend upper endoscopy with esophageal biopsies to rule out EOE.  Also without upper endoscopy cannot rule out ring lab or stricture which can be dilated at the time of upper endoscopy  Patient also reports symptoms of abdominal pain with heartburn previously responsive to PPI but symptoms recurred after stopping PPI.  This is considered refractory GERD and as per ACG guidelines wireless pH monitoring is indicated  Will plan for upper endoscopy with esophageal biopsies and dilation+/- Bravo if no esophagitis is encountered  Protonix 40mg  daily- advised to take 30 min before breakfast- STOP 2 weeks prior to endoscopy   1) Avoid coffee, tea, cola beverages, carbonated beverages, spicy foods, greasy foods, foods high in acid content (e.g. tomatoes and citrus fruits), chocolate, and peppermint 2) Avoid drinking alcoholic beverages 3) Avoid smoking 4) Eat small  meals and keep weight within normal range 5) Avoid recumbent posture for 3 hours post-prandially 6) Elevate head of bed  #Constipation Patient has occasional constipation with Bristol stool scale 2-3 with straining but improves with fiber Gummies  Ensure adequate fluid intake: Aim for 8 glasses of water daily. Follow a high fiber diet: Include foods such as dates, prunes, pears, and kiwi. Use Metamucil twice a day.  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  Patient was extensively counseled today regarding weight loss starting the lifestyle modification and if not able to progress the neck 6 months to consider pharmacological therapy       - walking at a brisk pace/biking at moderate intesity 2.5-5 hours per week     - use pedometer/step counter to track activity     - goal to lose 5-10% of initial body weight     - avoid suagry drinks  and juices, use zero calorie beverages     - increase water intake     - eat a low carb diet with plenty of veggies and fruit     - Get sufficient sleep 7-8 hrs nightly     - maitain active lifestyle     - avoid alcohol     - recommend 2-3 cups Coffee daily     - Counsel on lowering cholesterol by having a diet rich in vegetables,          protein (avoid red meats) and good fats(fish, salmon). - Consider addition of GLP-1 such as semaglutide. This will also help promote weight loss   All questions were answered.      Tahj Lindseth Faizan Glynn Freas, MD Gastroenterology and Hepatology [facility] Gastroenterology   This chart has been completed using [facility] Dictation  software, and while attempts have been made to ensure accuracy , certain words and phrases may not be transcribed as intended

## 2023-02-27 NOTE — Patient Instructions (Signed)
 It was very nice to meet you today, as dicussed with will plan for the following :  1)Protonix 40mg  daily- advised to take 30 min before breakfast- STOP 2 weeks prior to endoscopy   1) Avoid coffee, tea, cola beverages, carbonated beverages, spicy foods, greasy foods, foods high in acid content (e.g. tomatoes and citrus fruits), chocolate, and peppermint 2) Avoid drinking alcoholic beverages 3) Avoid smoking 4) Eat small meals and keep weight within normal range 5) Avoid recumbent posture for 3 hours post-prandially 6) Elevate head of bed  2)Ensure adequate fluid intake: Aim for 8 glasses of water daily. Follow a high fiber diet: Include foods such as dates, prunes, pears, and kiwi. Use Metamucil twice a day.   3) Lab work and ultrasound

## 2023-03-02 ENCOUNTER — Telehealth (INDEPENDENT_AMBULATORY_CARE_PROVIDER_SITE_OTHER): Payer: Self-pay | Admitting: Gastroenterology

## 2023-03-02 NOTE — Telephone Encounter (Signed)
 PA appoved via [employer]. Valid from 02/28/23-05/23/23 for both TCS and EGD

## 2023-03-05 ENCOUNTER — Encounter (INDEPENDENT_AMBULATORY_CARE_PROVIDER_SITE_OTHER): Payer: Self-pay

## 2023-03-07 ENCOUNTER — Ambulatory Visit (HOSPITAL_COMMUNITY)
Admission: RE | Admit: 2023-03-07 | Discharge: 2023-03-07 | Disposition: A | Discharge status: Home / Self Care | Payer: Medicaid Other | Source: Ambulatory Visit | Attending: Gastroenterology | Admitting: Gastroenterology

## 2023-03-07 DIAGNOSIS — K760 Fatty (change of) liver, not elsewhere classified: Secondary | ICD-10-CM | POA: Insufficient documentation

## 2023-03-07 DIAGNOSIS — R748 Abnormal levels of other serum enzymes: Secondary | ICD-10-CM | POA: Insufficient documentation

## 2023-03-09 LAB — ANA: Anti Nuclear Antibody (ANA): NEGATIVE

## 2023-03-11 LAB — COMPREHENSIVE METABOLIC PANEL
AG Ratio: 1.7 (calc) (ref 1.0–2.5)
ALT: 84 U/L — ABNORMAL HIGH (ref 9–46)
AST: 31 U/L (ref 10–40)
Albumin: 4.3 g/dL (ref 3.6–5.1)
Alkaline phosphatase (APISO): 63 U/L (ref 36–130)
BUN: 19 mg/dL (ref 7–25)
CO2: 25 mmol/L (ref 20–32)
Calcium: 9 mg/dL (ref 8.6–10.3)
Chloride: 107 mmol/L (ref 98–110)
Creat: 0.97 mg/dL (ref 0.60–1.26)
Globulin: 2.6 g/dL (ref 1.9–3.7)
Glucose, Bld: 86 mg/dL (ref 65–99)
Potassium: 4.2 mmol/L (ref 3.5–5.3)
Sodium: 140 mmol/L (ref 135–146)
Total Bilirubin: 0.5 mg/dL (ref 0.2–1.2)
Total Protein: 6.9 g/dL (ref 6.1–8.1)

## 2023-03-11 LAB — MITOCHONDRIAL ANTIBODIES: Mitochondrial M2 Ab, IgG: <=20 U (ref <=20.0)

## 2023-03-11 LAB — HEPATITIS B SURFACE ANTIGEN: Hepatitis B Surface Ag: NONREACTIVE

## 2023-03-11 LAB — HEPATITIS B SURFACE ANTIBODY,QUALITATIVE: Hep B S Ab: NONREACTIVE

## 2023-03-11 LAB — IRON,TIBC AND FERRITIN PANEL
%SAT: 34 % (ref 20–48)
Ferritin: 258 ng/mL (ref 38–380)
Iron: 100 ug/dL (ref 50–180)
TIBC: 291 ug/dL (ref 250–425)

## 2023-03-11 LAB — HEPATITIS A ANTIBODY, TOTAL: Hepatitis A AB,Total: REACTIVE — AB

## 2023-03-11 LAB — HIV ANTIBODY (ROUTINE TESTING W REFLEX): HIV 1&2 Ab, 4th Generation: NONREACTIVE

## 2023-03-11 LAB — PROTIME-INR
INR: 1
Prothrombin Time: 10.5 s (ref 9.0–11.5)

## 2023-03-11 LAB — ANTI-SMOOTH MUSCLE ANTIBODY, IGG: Actin (Smooth Muscle) Antibody (IGG): <20 U (ref <20)

## 2023-03-11 LAB — HEPATITIS C ANTIBODY: Hepatitis C Ab: NONREACTIVE

## 2023-03-11 LAB — HEPATITIS B CORE ANTIBODY, TOTAL: Hep B Core Total Ab: NONREACTIVE

## 2023-03-12 NOTE — Progress Notes (Signed)
Hi Wendy ,  Can you please call the patient and tell the patient the lab work shows slightly elevated liver enzymes which is likely because of fatty liver as suggested by the ultrasound.  All other blood work is normal.  I recommend weight loss with lifestyle modification for now and follow-up with primary care physician for possible vaccination against hepatitis B.  Please continue to follow-up in the liver clinic in 4 to 6 months  Also proceed with upper endoscopy as scheduled  Thanks,  Vista Lawman, MD Gastroenterology and Hepatology [facility] Gastroenterology ================ Ultrasound suggestive of steatosis  ALT 84 (elevated) AST 31 Hep A immune, hep B nonexposed nonimmune, hep C negative Negative ANA, AMA, HIV, ASMA INR 1 Ferritin 258

## 2023-03-28 ENCOUNTER — Telehealth (INDEPENDENT_AMBULATORY_CARE_PROVIDER_SITE_OTHER): Payer: Self-pay | Admitting: Gastroenterology

## 2023-03-28 NOTE — Telephone Encounter (Signed)
 Per Endo we only have one bravo study so the bravo for Friday will have to be canceled next week Contacted pt. Pt states he is ok with rescheduling but would have to get wife to call back to reschedule  EGD with Bravo ASA 3 Ahmed

## 2023-03-28 NOTE — Telephone Encounter (Signed)
 Pt returned call and has rescheduled to 05/11/23 at 10am. Will mail new instructions once pre op is received. PA for Bravo not needed per [facility]

## 2023-04-03 ENCOUNTER — Encounter (HOSPITAL_COMMUNITY): Payer: Medicaid Other

## 2023-04-11 ENCOUNTER — Other Ambulatory Visit (HOSPITAL_COMMUNITY): Payer: Medicaid Other

## 2023-05-08 NOTE — Patient Instructions (Signed)
 Alexander Rivas  05/08/2023     @PREFPERIOPPHARMACY @   Your procedure is scheduled on 05/11/2023.   [facility] to [facility] at  0800 A.M.   Call this number if you have problems the morning of surgery:  [phone]  If you experience any cold or flu symptoms such as cough, fever, chills, shortness of breath, etc. between now and your scheduled surgery, please notify us at the above number.   Remember:         Use your nebulizer before you come. Bring your rescue inhaler with you.   Follow the diet and prep instructions given to you from the office.    You may drink clear liquids until 0600 am on 05/11/2023.    Clear liquids allowed are:                    Water, Juice (No red color; non-citric and without pulp; diabetics please choose diet or no sugar options), Carbonated beverages (diabetics please choose diet or no sugar options), Clear Tea (No creamer, milk, or cream, including half & half and powdered creamer), Black Coffee Only (No creamer, milk or cream, including half & half and powdered creamer), and Clear Sports drink (No red color; diabetics please choose diet or no sugar options)    Take these medicines the morning of surgery with A SIP OF WATER                                                         None.    Do not wear jewelry, make-up or nail polish, including gel polish,  artificial nails, or any other type of covering on natural nails (fingers and  toes).  Do not wear lotions, powders, or perfumes, or deodorant.  Do not shave 48 hours prior to surgery.  Men may shave face and neck.  Do not bring valuables to the hospital.  [facility] is not responsible for any belongings or valuables.  Contacts, dentures or bridgework may not be worn into surgery.  Leave your suitcase in the car.  After surgery it may be brought to your room.  For patients admitted to the hospital, discharge time will be determined by your treatment team.  Patients discharged the  day of surgery will not be allowed to drive home and must have someone with them for 24 hours.    Special instructions:   DO NOT smoke tobacco or vape for 24 hours before your procedure.  Please read over the following fact sheets that you were given. Anesthesia Post-op Instructions and Care and Recovery After Surgery      Upper Endoscopy, Adult, Care After After the procedure, it is common to have a sore throat. It is also common to have: Mild stomach pain or discomfort. Bloating. Nausea. Follow these instructions at home: The instructions below may help you care for yourself at home. Your health care provider may give you more instructions. If you have questions, ask your health care provider. If you were given a sedative during the procedure, it can affect you for several hours. Do not drive or operate machinery until your health care provider says that it is safe. If you will be going home right after the procedure, plan to have a responsible adult: Take you  home from the hospital or clinic. You will not be allowed to drive. Care for you for the time you are told. Follow instructions from your health care provider about what you may eat and drink. Return to your normal activities as told by your health care provider. Ask your health care provider what activities are safe for you. Take over-the-counter and prescription medicines only as told by your health care provider. Contact a health care provider if you: Have a sore throat that lasts longer than one day. Have trouble swallowing. Have a fever. Get help right away if you: Vomit blood or your vomit looks like coffee grounds. Have bloody, black, or tarry stools. Have a very bad sore throat or you cannot swallow. Have difficulty breathing or very bad pain in your chest or abdomen. These symptoms may be an emergency. Get help right away. Call 911. Do not wait to see if the symptoms will go away. Do not drive yourself to the  hospital. Summary After the procedure, it is common to have a sore throat, mild stomach discomfort, bloating, and nausea. If you were given a sedative during the procedure, it can affect you for several hours. Do not drive until your health care provider says that it is safe. Follow instructions from your health care provider about what you may eat and drink. Return to your normal activities as told by your health care provider. This information is not intended to replace advice given to you by your health care provider. Make sure you discuss any questions you have with your health care provider. Document Revised: 05/18/2021 Document Reviewed: 05/18/2021 Elsevier Patient Education  2024 Elsevier Inc.General Anesthesia, Adult, Care After The following information offers guidance on how to care for yourself after your procedure. Your health care provider may also give you more specific instructions. If you have problems or questions, contact your health care provider. What can I expect after the procedure? After the procedure, it is common for people to: Have pain or discomfort at the IV site. Have nausea or vomiting. Have a sore throat or hoarseness. Have trouble concentrating. Feel cold or chills. Feel weak, sleepy, or tired (fatigue). Have soreness and body aches. These can affect parts of the body that were not involved in surgery. Follow these instructions at home: For the time period you were told by your health care provider:  Rest. Do not participate in activities where you could fall or become injured. Do not drive or use machinery. Do not drink alcohol. Do not take sleeping pills or medicines that cause drowsiness. Do not make important decisions or sign legal documents. Do not take care of children on your own. General instructions Drink enough fluid to keep your urine pale yellow. If you have sleep apnea, surgery and certain medicines can increase your risk for breathing  problems. Follow instructions from your health care provider about wearing your sleep device: Anytime you are sleeping, including during daytime naps. While taking prescription pain medicines, sleeping medicines, or medicines that make you drowsy. Return to your normal activities as told by your health care provider. Ask your health care provider what activities are safe for you. Take over-the-counter and prescription medicines only as told by your health care provider. Do not use any products that contain nicotine or tobacco. These products include cigarettes, chewing tobacco, and vaping devices, such as e-cigarettes. These can delay incision healing after surgery. If you need help quitting, ask your health care provider. Contact a health care provider if:  You have nausea or vomiting that does not get better with medicine. You vomit every time you eat or drink. You have pain that does not get better with medicine. You cannot urinate or have bloody urine. You develop a skin rash. You have a fever. Get help right away if: You have trouble breathing. You have chest pain. You vomit blood. These symptoms may be an emergency. Get help right away. Call 911. Do not wait to see if the symptoms will go away. Do not drive yourself to the hospital. Summary After the procedure, it is common to have a sore throat, hoarseness, nausea, vomiting, or to feel weak, sleepy, or fatigue. For the time period you were told by your health care provider, do not drive or use machinery. Get help right away if you have difficulty breathing, have chest pain, or vomit blood. These symptoms may be an emergency. This information is not intended to replace advice given to you by your health care provider. Make sure you discuss any questions you have with your health care provider. Document Revised: 05/06/2021 Document Reviewed: 05/06/2021 Elsevier Patient Education  2024 [employer].

## 2023-05-09 ENCOUNTER — Encounter (HOSPITAL_COMMUNITY): Payer: Self-pay

## 2023-05-09 ENCOUNTER — Encounter (HOSPITAL_COMMUNITY)
Admission: RE | Admit: 2023-05-09 | Discharge: 2023-05-09 | Disposition: A | Discharge status: Home / Self Care | Payer: Medicaid Other | Source: Ambulatory Visit | Attending: Gastroenterology | Admitting: Gastroenterology

## 2023-05-09 VITALS — BP 117/81 | HR 79 | Temp 98.0°F | Resp 18 | Ht 72.0 in | Wt 342.8 lb

## 2023-05-09 DIAGNOSIS — R638 Other symptoms and signs concerning food and fluid intake: Secondary | ICD-10-CM | POA: Diagnosis not present

## 2023-05-09 DIAGNOSIS — Z01818 Encounter for other preprocedural examination: Secondary | ICD-10-CM | POA: Diagnosis present

## 2023-05-09 DIAGNOSIS — R748 Abnormal levels of other serum enzymes: Secondary | ICD-10-CM | POA: Insufficient documentation

## 2023-05-09 DIAGNOSIS — K760 Fatty (change of) liver, not elsewhere classified: Secondary | ICD-10-CM

## 2023-05-09 HISTORY — DX: Anxiety disorder, unspecified: F41.9

## 2023-05-09 HISTORY — DX: Other specified postprocedural states: Z98.890

## 2023-05-09 HISTORY — DX: Nausea with vomiting, unspecified: R11.2

## 2023-05-09 HISTORY — DX: Other specified postprocedural states: R11.2

## 2023-05-09 LAB — COMPREHENSIVE METABOLIC PANEL
ALT: 71 U/L — ABNORMAL HIGH (ref 0–44)
AST: 34 U/L (ref 15–41)
Albumin: 4.1 g/dL (ref 3.5–5.0)
Alkaline Phosphatase: 60 U/L (ref 38–126)
Anion gap: 9 (ref 5–15)
BUN: 13 mg/dL (ref 6–20)
CO2: 26 mmol/L (ref 22–32)
Calcium: 9.3 mg/dL (ref 8.9–10.3)
Chloride: 103 mmol/L (ref 98–111)
Creatinine, Ser: 1.01 mg/dL (ref 0.61–1.24)
GFR, Estimated: >60 mL/min (ref >60)
Glucose, Bld: 98 mg/dL (ref 70–99)
Potassium: 4.2 mmol/L (ref 3.5–5.1)
Sodium: 138 mmol/L (ref 135–145)
Total Bilirubin: 0.4 mg/dL (ref 0.0–1.2)
Total Protein: 7.3 g/dL (ref 6.5–8.1)

## 2023-05-11 ENCOUNTER — Encounter (HOSPITAL_COMMUNITY): Payer: Self-pay | Admitting: Gastroenterology

## 2023-05-11 ENCOUNTER — Ambulatory Visit (HOSPITAL_COMMUNITY): Admitting: Anesthesiology

## 2023-05-11 ENCOUNTER — Ambulatory Visit (HOSPITAL_COMMUNITY)
Admission: RE | Admit: 2023-05-11 | Discharge: 2023-05-11 | Disposition: A | Discharge status: Home / Self Care | Payer: Medicaid Other | Attending: Gastroenterology | Admitting: Gastroenterology

## 2023-05-11 ENCOUNTER — Encounter (INDEPENDENT_AMBULATORY_CARE_PROVIDER_SITE_OTHER): Payer: Self-pay | Admitting: Gastroenterology

## 2023-05-11 ENCOUNTER — Encounter (HOSPITAL_COMMUNITY)
Admission: RE | Disposition: A | Discharge status: Home / Self Care | Payer: Self-pay | Source: Home / Self Care | Attending: Gastroenterology

## 2023-05-11 DIAGNOSIS — J45909 Unspecified asthma, uncomplicated: Secondary | ICD-10-CM | POA: Insufficient documentation

## 2023-05-11 DIAGNOSIS — K219 Gastro-esophageal reflux disease without esophagitis: Secondary | ICD-10-CM | POA: Diagnosis not present

## 2023-05-11 DIAGNOSIS — R1310 Dysphagia, unspecified: Secondary | ICD-10-CM | POA: Insufficient documentation

## 2023-05-11 DIAGNOSIS — F32A Depression, unspecified: Secondary | ICD-10-CM | POA: Diagnosis not present

## 2023-05-11 DIAGNOSIS — K2970 Gastritis, unspecified, without bleeding: Secondary | ICD-10-CM | POA: Diagnosis not present

## 2023-05-11 DIAGNOSIS — K2990 Gastroduodenitis, unspecified, without bleeding: Secondary | ICD-10-CM | POA: Diagnosis not present

## 2023-05-11 DIAGNOSIS — K2980 Duodenitis without bleeding: Secondary | ICD-10-CM | POA: Insufficient documentation

## 2023-05-11 DIAGNOSIS — Z79899 Other long term (current) drug therapy: Secondary | ICD-10-CM | POA: Insufficient documentation

## 2023-05-11 DIAGNOSIS — K2100 Gastro-esophageal reflux disease with esophagitis, without bleeding: Secondary | ICD-10-CM | POA: Insufficient documentation

## 2023-05-11 DIAGNOSIS — K30 Functional dyspepsia: Secondary | ICD-10-CM | POA: Diagnosis not present

## 2023-05-11 DIAGNOSIS — Z87891 Personal history of nicotine dependence: Secondary | ICD-10-CM

## 2023-05-11 DIAGNOSIS — R12 Heartburn: Secondary | ICD-10-CM

## 2023-05-11 DIAGNOSIS — F419 Anxiety disorder, unspecified: Secondary | ICD-10-CM | POA: Diagnosis not present

## 2023-05-11 HISTORY — PX: BRAVO PH STUDY: SHX5421

## 2023-05-11 HISTORY — PX: SAVORY DILATION: SHX5439

## 2023-05-11 HISTORY — PX: ESOPHAGOGASTRODUODENOSCOPY (EGD) WITH PROPOFOL: SHX5813

## 2023-05-11 SURGERY — ESOPHAGOGASTRODUODENOSCOPY (EGD) WITH PROPOFOL
Anesthesia: General

## 2023-05-11 MED ORDER — SODIUM CHLORIDE 0.9% FLUSH: 3.0000 mL | Freq: Two times a day (BID) | INTRAVENOUS | Status: DC

## 2023-05-11 MED ORDER — LIDOCAINE HCL (CARDIAC) PF 100 MG/5ML IV SOSY
PREFILLED_SYRINGE | INTRAVENOUS | Status: DC | PRN
  Administered 2023-05-11: 50 mg via INTRATRACHEAL

## 2023-05-11 MED ORDER — SODIUM CHLORIDE 0.9% FLUSH: 3.0000 mL | INTRAVENOUS | Status: DC | PRN

## 2023-05-11 MED ORDER — PROPOFOL 10 MG/ML IV BOLUS
INTRAVENOUS | Status: DC | PRN
  Administered 2023-05-11: 20 mg via INTRAVENOUS
  Administered 2023-05-11: 75 mg via INTRAVENOUS

## 2023-05-11 MED ORDER — LACTATED RINGERS IV SOLN: INTRAVENOUS | Status: DC | PRN

## 2023-05-11 MED ORDER — PROPOFOL 500 MG/50ML IV EMUL
INTRAVENOUS | Status: DC | PRN
  Administered 2023-05-11: 150 ug/kg/min via INTRAVENOUS
  Administered 2023-05-11: 120 ug/kg/min via INTRAVENOUS

## 2023-05-11 NOTE — Anesthesia Postprocedure Evaluation (Signed)
 Anesthesia Post Note  Patient: Alexander Rivas  Procedure(s) Performed: ESOPHAGOGASTRODUODENOSCOPY (EGD) WITH PROPOFOL PH MONITORING, ESOPHAGUS, WIRELESS EGD, WITH DILATION USING SAVARY-GILLIARD DILATOR OVER GUIDEWIRE  Patient location during evaluation: Short Stay Anesthesia Type: General Level of consciousness: awake and alert Pain management: pain level controlled Vital Signs Assessment: post-procedure vital signs reviewed and stable Respiratory status: spontaneous breathing Cardiovascular status: blood pressure returned to baseline and stable Postop Assessment: no apparent nausea or vomiting Anesthetic complications: no   No notable events documented.   Last Vitals:  Vitals:   05/11/23 0804  BP: 107/67  Pulse: 63  Resp: 16  Temp: 37 C  SpO2: 97%    Last Pain:  Vitals:   05/11/23 1015  TempSrc:   PainSc: 3                  Nedim Oki

## 2023-05-11 NOTE — Discharge Instructions (Signed)

## 2023-05-11 NOTE — Progress Notes (Signed)
 Patient told to bring Bravo study apparatus/paperwork back to hospital on Wednesday March 26 at 0830.  Patient forgot about a doctors appointment for his child in [place] that morning, so him or his wife will bring the device back that day but later in the morning.

## 2023-05-11 NOTE — H&P (Signed)
 Primary Care Physician:  Christena Deem, DO Primary Gastroenterologist:  Dr. Tasia Catchings  Pre-Procedure History & Physical: HPI: Alexander Rivas is a 33 y.o. male with depression , anxiety, asthma who presents for evaluation of Refractory GERD, Dysphagia     Reports indigestion which she explains as upper abdominal discomfort with without any relieving or aggravating factors.  Patient reports solid food dysphagia for many years and reports it runs in the family.  Patient took omeprazole but reports that he had some sort of mental breakdown after taking that and was switched to pantoprazole which did help his symptoms recurred after discontinuing The patient denies having any nausea, vomiting, fever, chills, hematochezia, melena, hematemesis, abdominal distention, abdominal pain, diarrhea, jaundice, pruritus or weight loss.   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 Authorizing Provider  albuterol (PROVENTIL) (2.5 MG/3ML) 0.083% nebulizer solution Take 2.5 mg by nebulization every 6 (six) hours as needed for wheezing or shortness of breath.    [provider]  psyllium (METAMUCIL) 58.6 % packet Take 1 packet by mouth 2 (two) times daily. 02/27/23 05/28/23  Franky Macho, MD    Allergies as of 02/28/2023 - Review Complete 02/27/2023  Allergen Reaction Noted   Codeine  07/28/2013   Other  02/27/2023   Sulfa antibiotics Rash 10/16/2011    History reviewed. No pertinent family history.  Social History    Socioeconomic History   Marital status: Single    Spouse name: Not on file   Number of children: Not on file   Years of education: Not on file   Highest education level: Not on file  Occupational History   Not on file  Tobacco Use   Smoking status: Former    Types: E-cigarettes    Passive exposure: Current   Smokeless tobacco: Never  Vaping Use   Vaping status: Every Day  Substance and Sexual Activity   Alcohol use: Not Currently    Comment: about once a year   Drug use: Yes    Types: Marijuana   Sexual activity: Not on file  Other Topics Concern   Not on file  Social History Narrative   Not on file   Social Drivers of Health   Financial Resource Strain: Not on file  Food Insecurity: Not on file  Transportation Needs: Not on file  Physical Activity: Not on file  Stress: Not on file  Social Connections: Not on file  Intimate Partner Violence: Not on file    Review of Systems: See HPI, otherwise negative ROS  Physical Exam: Vital signs in last 24 hours: Temp:  [98.6 F (37 C)] 98.6 F (37 C) (03/21 0804) Pulse Rate:  [63] 63 (03/21 0804) Resp:  [16] 16 (03/21 0804) BP: (107)/(67) 107/67 (03/21 0804) SpO2:  [97 %] 97 % (03/21 0804) Weight:  [155.5 kg] 155.5 kg (03/21 0804)   General:   Alert,  Well-developed, well-nourished, pleasant and cooperative in NAD Head:  Normocephalic and atraumatic. Eyes:  Sclera clear, no icterus.  Conjunctiva pink. Ears:  Normal auditory acuity. Nose:  No deformity, discharge,  or lesions. Msk:  Symmetrical without gross deformities. Normal posture. Extremities:  Without clubbing or edema. Neurologic:  Alert and  oriented x4;  grossly normal neurologically. Skin:  Intact without significant lesions or rashes. Psych:  Alert and cooperative. Normal mood and affect.  Impression/Plan:  Alexander Rivas is a 33 y.o. male with depression , anxiety, asthma who presents for evaluation of Refractory GERD, Dysphagia  Proceed with  EGD +/-dilation/BRAVO   The risks of the procedure including infection, bleed, or perforation as well as benefits, limitations, alternatives and imponderables have been reviewed with the patient. Questions have been answered. All parties agreeable.

## 2023-05-11 NOTE — Anesthesia Preprocedure Evaluation (Addendum)
 Anesthesia Evaluation  Patient identified by MRN, date of birth, ID band Patient awake    Reviewed: Allergy & Precautions, H&P , NPO status , Patient's Chart, lab work & pertinent test results, reviewed documented beta blocker date and time   History of Anesthesia Complications (+) PONV and history of anesthetic complications  Airway Mallampati: II  TM Distance: >3 FB Neck ROM: full    Dental no notable dental hx. (+) Dental Advisory Given, Teeth Intact   Pulmonary asthma , Patient abstained from smoking., former smoker   Pulmonary exam normal breath sounds clear to auscultation       Cardiovascular Exercise Tolerance: Good negative cardio ROS Normal cardiovascular exam Rhythm:regular Rate:Normal     Neuro/Psych  PSYCHIATRIC DISORDERS Anxiety Depression    negative neurological ROS     GI/Hepatic Neg liver ROS,GERD  ,,  Endo/Other    Class 3 obesity  Renal/GU negative Renal ROS  negative genitourinary   Musculoskeletal   Abdominal   Peds  Hematology negative hematology ROS (+)   Anesthesia Other Findings   Reproductive/Obstetrics negative OB ROS                             Anesthesia Physical Anesthesia Plan  ASA: 3  Anesthesia Plan: General   Post-op Pain Management: Minimal or no pain anticipated   Induction: Intravenous  PONV Risk Score and Plan: Propofol infusion  Airway Management Planned: Nasal Cannula and Natural Airway  Additional Equipment: None  Intra-op Plan:   Post-operative Plan:   Informed Consent: I have reviewed the patients History and Physical, chart, labs and discussed the procedure including the risks, benefits and alternatives for the proposed anesthesia with the patient or authorized representative who has indicated his/her understanding and acceptance.     Dental Advisory Given  Plan Discussed with: CRNA  Anesthesia Plan Comments:          Anesthesia Quick Evaluation

## 2023-05-11 NOTE — Transfer of Care (Signed)
 Immediate Anesthesia Transfer of Care Note  Patient: Alexander Rivas  Procedure(s) Performed: ESOPHAGOGASTRODUODENOSCOPY (EGD) WITH PROPOFOL PH MONITORING, ESOPHAGUS, WIRELESS EGD, WITH DILATION USING SAVARY-GILLIARD DILATOR OVER GUIDEWIRE  Patient Location: Short Stay  Anesthesia Type:General  Level of Consciousness: awake  Airway & Oxygen Therapy: Patient Spontanous Breathing  Post-op Assessment: Report given to RN  Post vital signs: Reviewed and stable  Last Vitals:  Vitals Value Taken Time  BP    Temp    Pulse    Resp    SpO2      Last Pain:  Vitals:   05/11/23 1015  TempSrc:   PainSc: 3          Complications: No notable events documented.

## 2023-05-11 NOTE — Op Note (Signed)
 [facility] Patient Name: Alexander Rivas Procedure Date: 05/11/2023 9:59 AM MRN: [id number] Date of Birth: 19-Sep-1990 Attending MD: Sanjuan Dame , MD, [id number] CSN: [id number] Age: 33 Admit Type: Outpatient Procedure:                Upper GI endoscopy Indications:              Dysphagia, Suspected non-erosive esophageal reflux,                            Esophageal reflux symptoms that persist despite                            appropriate therapy Providers:                Sanjuan Dame, MD, Sheran Fava, Edrick Kins, RN, Lennice Sites Technician, Technician Referring MD:              Medicines:                Monitored Anesthesia Care Complications:            No immediate complications. Estimated Blood Loss:     Estimated blood loss was minimal. Procedure:                Pre-Anesthesia Assessment:                           - Prior to the procedure, a History and Physical                            was performed, and patient medications and                            allergies were reviewed. The patient's tolerance of                            previous anesthesia was also reviewed. The risks                            and benefits of the procedure and the sedation                            options and risks were discussed with the patient.                            All questions were answered, and informed consent                            was obtained. Prior Anticoagulants: The patient has                            taken no anticoagulant or antiplatelet agents. ASA  Grade Assessment: III - A patient with severe                            systemic disease. After reviewing the risks and                            benefits, the patient was deemed in satisfactory                            condition to undergo the procedure.                           After obtaining informed consent, the endoscope was                             passed under direct vision. Throughout the                            procedure, the patient's blood pressure, pulse, and                            oxygen saturations were monitored continuously. The                            [id number]-H190 ([id number]) scope was introduced through the                            mouth, and advanced to the second part of duodenum.                            The upper GI endoscopy was accomplished without                            difficulty. The patient tolerated the procedure                            well. Scope In: 10:22:53 AM Scope Out: 10:38:12 AM Total Procedure Duration: 0 hours 15 minutes 19 seconds  Findings:      Esophagogastric landmarks were identified: the Z-line was found at 42 cm       and the gastroesophageal junction was found at 42 cm from the incisors.      No endoscopic abnormality was evident in the esophagus to explain the       patient's complaint of dysphagia. It was decided, however, to proceed       with dilation of the entire esophagus. A guidewire was placed and the       scope was withdrawn. Dilation was performed with a Savary dilator with       mild resistance at 18 mm. The dilation site was examined following       endoscope reinsertion and showed no change. Biopsies were obtained from       the proximal and distal esophagus with cold forceps for histology of       suspected eosinophilic esophagitis. The BRAVO capsule with delivery       system was introduced  through the mouth and advanced into the esophagus,       such that the BRAVO pH capsule was positioned 36 cm from the incisors,       which was 6 cm proximal to the GE junction. The BRAVO pH capsule was       then deployed and attached to the esophageal mucosa. The delivery system       was then withdrawn. Endoscopy was utilized for probe placement and       diagnostic evaluation.      The gastroesophageal flap valve was visualized endoscopically and        classified as Hill Grade I (prominent fold, tight to endoscope).      Patchy inflammation characterized by erosions was found in the gastric       antrum. Biopsies were taken with a cold forceps for histology.      Diffuse moderate inflammation characterized by erosions and erythema was       found in the duodenal bulb and in the second portion of the duodenum.       Biopsies were taken with a cold forceps for histology. Impression:               - Esophagogastric landmarks identified.                           - No endoscopic esophageal abnormality to explain                            patient's dysphagia. Esophagus dilated. Dilated.                           - Gastroesophageal flap valve classified as Hill                            Grade I (prominent fold, tight to endoscope).                           - Gastritis. Biopsied.                           - Duodenitis. Biopsied.                           - Biopsies were taken with a cold forceps for                            evaluation of eosinophilic esophagitis.                           - The BRAVO pH capsule was deployed. Moderate Sedation:      Per Anesthesia Care Recommendation:           - Patient has a contact number available for                            emergencies. The signs and symptoms of potential                            delayed complications were discussed with  the                            patient. Return to normal activities tomorrow.                            Written discharge instructions were provided to the                            patient.                           - Resume previous diet.                           - Continue present medications.                           - Await pathology results. Procedure Code(s):        --- Professional ---                           [phone], Esophagogastroduodenoscopy, flexible,                            transoral; with insertion of guide wire followed by                             passage of dilator(s) through esophagus over guide                            wire                           43239, 59, Esophagogastroduodenoscopy, flexible,                            transoral; with biopsy, single or multiple Diagnosis Code(s):        --- Professional ---                           R13.10, Dysphagia, unspecified                           K29.70, Gastritis, unspecified, without bleeding                           K29.80, Duodenitis without bleeding                           K21.9, Gastro-esophageal reflux disease without                            esophagitis CPT copyright 2022 American [employer] Association. All rights reserved. The codes documented in this report are preliminary and upon coder review may  be revised to meet current compliance requirements. Sanjuan Dame, MD Sanjuan Dame, MD 05/11/2023 10:47:37 AM This report has been signed electronically. Number of Addenda: 0

## 2023-05-14 ENCOUNTER — Encounter (HOSPITAL_COMMUNITY): Payer: Self-pay | Admitting: Gastroenterology

## 2023-05-14 LAB — SURGICAL PATHOLOGY

## 2023-05-28 ENCOUNTER — Other Ambulatory Visit (HOSPITAL_COMMUNITY): Payer: Self-pay | Admitting: Gastroenterology

## 2023-05-28 DIAGNOSIS — K219 Gastro-esophageal reflux disease without esophagitis: Secondary | ICD-10-CM

## 2023-05-28 DIAGNOSIS — R12 Heartburn: Secondary | ICD-10-CM | POA: Diagnosis not present

## 2023-05-28 MED ORDER — BUSPIRONE HCL 5 MG PO TABS
5.0000 mg | ORAL_TABLET | Freq: Two times a day (BID) | ORAL | 2 refills | Status: AC
Start: 2023-05-28 — End: 2023-08-26

## 2023-05-28 NOTE — Procedures (Signed)
 Pathology and BRAVO report   Path   A. DUODENUM, BIOPSY:  - Focal active duodenitis.  See comment  - Negative for dysplasia malignancy   B. STOMACH, BIOPSY:  - Gastric antral and oxyntic mucosa with no specific pathologic changes  - Negative for H. pylori on HE stain  - Negative for intestinal metaplasia or malignancy   C. ESOPHAGUS, BIOPSY:  - Benign squamous mucosa with mild reflux changes  - Negative for intestinal metaplasia, dysplasia or malignancy    COMMENT:   The duodenal biopsy shows mild acute duodenitis. No features of celiac  disease are seen. The differential diagnosis includes peptic duodenitis,  medications (particularly NSAIDs), and, possibly, an infection. Clinical  correlation is recommended.    BRAVO      Procedure Description  The study was performed with the BRAVO pH capsule telemetry system. A routine upper endoscopy was performed and then BRAVO wireless pH telemetry probe was placed under endoscopic guidance 6 cm proximal to the Z line. The patient was then discharged home and kept a symptom diary for 96HRs, afterwhich the wireless telemetry receiver and diary were brought back to the endoscopy unit. The information was then downloaded and analyzed.     Indications  Refractory GERD     Interpretations   This was a normal study performed off of acid suppressive therapy.  The study was done for total of 48 hours as on day 3 ,probe appears to be dislodged from the esophagus into the stomach and small bowel .There was physiological esophageal acid exposure on Day 1 (1.2%) and Day 2 (1.9%)  . Total cumulate AET is not elevated at 1.2%. The DeMeester Score was not elevated at 5.6 for all days combined. There was poor correlation between symptom and reflux events for heartburn and chest pain during the 48 hour study period, with total 16 occurences reported . Although for chest pain correlation wis "equivocal/indterminate" given SAP>96% but SI<50. These results  suggest that the patient likely has functional heartburn , however it is unclear if reflux is responsible for patient's symptom .  Clinical correlation is recommended.     Recommendation   -Advise weight loss -Avoid NSAIDs -Anti reflux measures  -Given likely functional heartburn will start on : Buspirone 5mg  BID and uptitrate to 10mg  BID to TID -If Patient continues to have symptoms may need pH impedance off PPI in future  Spoke to the patient over the phone

## 2023-06-22 ENCOUNTER — Encounter: Payer: Self-pay | Admitting: Radiology

## 2023-08-17 ENCOUNTER — Ambulatory Visit (INDEPENDENT_AMBULATORY_CARE_PROVIDER_SITE_OTHER): Admitting: Gastroenterology

## 2023-12-05 ENCOUNTER — Encounter (INDEPENDENT_AMBULATORY_CARE_PROVIDER_SITE_OTHER): Payer: Self-pay | Admitting: Gastroenterology

## 2024-03-04 ENCOUNTER — Ambulatory Visit
Admission: RE | Admit: 2024-03-04 | Discharge: 2024-03-04 | Disposition: A | Discharge status: Home / Self Care | Source: Ambulatory Visit | Attending: Nurse Practitioner | Admitting: Nurse Practitioner

## 2024-03-04 VITALS — BP 133/86 | HR 79 | Temp 97.7°F | Resp 18

## 2024-03-04 DIAGNOSIS — Z8709 Personal history of other diseases of the respiratory system: Secondary | ICD-10-CM

## 2024-03-04 DIAGNOSIS — J209 Acute bronchitis, unspecified: Secondary | ICD-10-CM | POA: Diagnosis not present

## 2024-03-04 MED ORDER — ALBUTEROL SULFATE (2.5 MG/3ML) 0.083% IN NEBU: 2.5000 mg | INHALATION_SOLUTION | Freq: Four times a day (QID) | RESPIRATORY_TRACT | 0 refills | Status: AC | PRN

## 2024-03-04 MED ORDER — METHYLPREDNISOLONE SODIUM SUCC 125 MG IJ SOLR
80.0000 mg | Freq: Once | INTRAMUSCULAR | Status: AC
  Administered 2024-03-04: 80 mg via INTRAMUSCULAR

## 2024-03-04 MED ORDER — ALBUTEROL SULFATE HFA 108 (90 BASE) MCG/ACT IN AERS: 2.0000 | INHALATION_SPRAY | Freq: Four times a day (QID) | RESPIRATORY_TRACT | 0 refills | Status: AC | PRN

## 2024-03-04 MED ORDER — IPRATROPIUM-ALBUTEROL 0.5-2.5 (3) MG/3ML IN SOLN
3.0000 mL | Freq: Once | RESPIRATORY_TRACT | Status: AC
  Administered 2024-03-04: 3 mL via RESPIRATORY_TRACT

## 2024-03-04 MED ORDER — PREDNISONE 20 MG PO TABS: 40.0000 mg | ORAL_TABLET | Freq: Every day | ORAL | 0 refills | Status: AC

## 2024-03-04 NOTE — ED Triage Notes (Signed)
 History of viral illness 2 weeks ago.  Continues to cough and wheeze.  States cough is productive.  Has been taking thera flu without relief.

## 2024-03-04 NOTE — ED Provider Notes (Signed)
 " [facility]    CSN: [id number] Arrival date & time: 03/04/24  1759      History   Chief Complaint Chief Complaint  Patient presents with   Wheezing    Entered by patient    HPI SYMIR MAH is a 34 y.o. male.   The history is provided by the patient.   Patient presents for complaints of wheezing and cough that has been present for the past 2 weeks.  Patient states that he had a viral illness which he believes was the flu at that time.  States he continues to cough and wheeze.  He states I have a funny feeling in my chest, is like my lungs hurt.  States that he notices the wheezing when he is trying to breathe and deep into take a deep breath.  Patient denies fever, chills, headache, ear pain, chest pain, abdominal pain, nausea, vomiting, diarrhea, or rash.  States that he has been using over-the-counter medications and nebulizer treatments with minimal relief.  Patient with underlying history of asthma, states he has not had a problem with his asthma since he was a child. 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   Gastritis and gastroduodenitis 05/11/2023   Metabolic dysfunction-associated steatotic liver disease (MASLD) 02/27/2023   Elevated liver enzymes 02/27/2023   Chronic idiopathic constipation 02/27/2023   Gastroesophageal reflux disease 02/27/2023   Esophageal dysphagia 02/27/2023   Epididymal cyst, left 09/22/2021    Past Surgical History:  Procedure Laterality Date   BRAVO PH STUDY N/A 05/11/2023   Procedure: PH MONITORING, ESOPHAGUS, WIRELESS;  Surgeon: Cinderella Deatrice FALCON, MD;  Location: [facility];  Service: Endoscopy;  Laterality: N/A;  10:00AM;ASA 3   ESOPHAGOGASTRODUODENOSCOPY (EGD) WITH PROPOFOL  N/A 05/11/2023   Procedure: ESOPHAGOGASTRODUODENOSCOPY (EGD) WITH PROPOFOL ;   Surgeon: Cinderella Deatrice FALCON, MD;  Location: [facility];  Service: Endoscopy;  Laterality: N/A;  10:00AM;ASA 3   KNEE SURGERY Bilateral    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 Authorizing Provider  albuterol  (PROVENTIL ) (2.5 MG/3ML) 0.083% nebulizer solution Take 2.5 mg by nebulization every 6 (six) hours as needed for wheezing or shortness of breath.    [provider]    Family History History reviewed. No pertinent family history.  Social History Social History[1]   Allergies   Codeine, Other, and Sulfa antibiotics   Review of Systems Review of Systems   Physical Exam Triage Vital Signs ED Triage Vitals  Encounter Vitals Group     BP 03/04/24 1810 133/86     Girls Systolic BP Percentile --      Girls Diastolic BP Percentile --      Boys Systolic BP Percentile --      Boys Diastolic BP Percentile --      Pulse Rate 03/04/24 1810 86     Resp 03/04/24 1810 18     Temp 03/04/24 1810 97.7 F (36.5 C)     Temp Source 03/04/24 1810 Oral  SpO2 03/04/24 1810 95 %     Weight --      Height --      Head Circumference --      Peak Flow --      Pain Score 03/04/24 1811 3     Pain Loc --      Pain Education --      Exclude from Growth Chart --    No data found.  Updated Vital Signs BP 133/86 (BP Location: Right Arm)   Pulse 86   Temp 97.7 F (36.5 C) (Oral)   Resp 18   SpO2 95%   Visual Acuity Right Eye Distance:   Left Eye Distance:   Bilateral Distance:    Right Eye Near:   Left Eye Near:    Bilateral Near:     Physical Exam Vitals and nursing note reviewed.  Constitutional:      General: He is not in acute distress.    Appearance: Normal  appearance.  HENT:     Head: Normocephalic.     Right Ear: Tympanic membrane, ear canal and external ear normal.     Left Ear: Tympanic membrane, ear canal and external ear normal.     Nose: Nose normal.     Mouth/Throat:     Mouth: Mucous membranes are moist.     Pharynx: No posterior oropharyngeal erythema.  Eyes:     Extraocular Movements: Extraocular movements intact.     Pupils: Pupils are equal, round, and reactive to light.  Cardiovascular:     Rate and Rhythm: Normal rate and regular rhythm.     Pulses: Normal pulses.     Heart sounds: Normal heart sounds.  Pulmonary:     Effort: Pulmonary effort is normal. No respiratory distress.     Breath sounds: No stridor. Wheezing (Patient expiratory wheezing noted in the posterior bilateral lower lobes) and rhonchi (Rhonchi present, clears with cough) present. No rales.     Comments: DuoNeb administered, post DuoNeb, lung sounds were clear throughout. Abdominal:     General: Bowel sounds are normal.     Palpations: Abdomen is soft.     Tenderness: There is no abdominal tenderness.  Musculoskeletal:     Cervical back: Normal range of motion.  Skin:    General: Skin is warm and dry.  Neurological:     General: No focal deficit present.     Mental Status: He is alert and oriented to person, place, and time.  Psychiatric:        Mood and Affect: Mood normal.        Behavior: Behavior normal.      UC Treatments / Results  Labs (all labs ordered are listed, but only abnormal results are displayed) Labs Reviewed - No data to display  EKG   Radiology No results found.  Procedures Procedures (including critical care time)  Medications Ordered in UC Medications - No data to display  Initial Impression / Assessment and Plan / UC Course  I have reviewed the triage vital signs and the nursing notes.  Pertinent labs & imaging results that were available during my care of the patient were reviewed by me and considered in my  medical decision making (see chart for details).  Patient presents for complaints of cough and wheezing that is been persistent since a viral illness over the past 2 weeks.  He does have faint expiratory wheezing and rhonchi, rhonchi clears with cough.  DuoNeb was administered, post DuoNeb, lungs were clear  throughout, room air sats are at 97%.  Patient also administered Solu-Medrol  80 mg IM for bronchial inflammation.  Symptoms are consistent with viral bronchitis.  Will provide symptomatic treatment with prednisone  40 mg for bronchial inflammation, and albuterol  inhaler, and albuterol  nebulizer solution.  Has elected to continue over-the-counter cough medications at this time.  Supportive care recommendations were provided and discussed with the patient to include fluids, rest, over-the-counter analgesics, and use of a humidifier during sleep.  Discussed indications with patient regarding follow-up.  Patient was in agreement with this plan of care and verbalizes understanding.  All questions were answered.  Patient stable for discharge.   Final Clinical Impressions(s) / UC Diagnoses   Final diagnoses:  None   Discharge Instructions   None    ED Prescriptions   None    PDMP not reviewed this encounter.     [1]  Social History Tobacco Use   Smoking status: Former    Types: E-cigarettes    Passive exposure: Current   Smokeless tobacco: Never  Vaping Use   Vaping status: Every Day  Substance Use Topics   Alcohol use: Not Currently    Comment: about once a year   Drug use: Yes    Types: Marijuana     Gilmer Etta PARAS, NP 03/04/24 1900  "

## 2024-03-04 NOTE — Discharge Instructions (Signed)
 You were given an injection of Solu-Medrol  80 mg and a duo nebulizer treatment.  Start the prednisone  tomorrow. Take medication as prescribed. Increase fluids and allow for plenty of rest. You may take over-the-counter Tylenol  or ibuprofen  as needed for pain, fever, or general discomfort. Recommend use of a humidifier in your bedroom at nighttime during sleep and sleeping elevated on pillows while symptoms persist. As discussed, if you continue to have a persistent cough, but are generally feeling well, continue over-the-counter medications, fluids, and cough drops.  Seek care if you develop worsening wheezing, shortness of breath, difficulty breathing, fever, or other concerns. Follow-up as needed.
